# Patient Record
Sex: Female | Born: 1990 | Race: White | Hispanic: No | Marital: Single | Smoking: Never smoker
Health system: Southern US, Community
[De-identification: ages and names within clinical notes are randomized; demographics above are authoritative.]

## PROBLEM LIST (undated history)

## (undated) DIAGNOSIS — I498 Other specified cardiac arrhythmias: Secondary | ICD-10-CM

## (undated) DIAGNOSIS — Z5189 Encounter for other specified aftercare: Secondary | ICD-10-CM

## (undated) DIAGNOSIS — I951 Orthostatic hypotension: Secondary | ICD-10-CM

## (undated) DIAGNOSIS — F909 Attention-deficit hyperactivity disorder, unspecified type: Secondary | ICD-10-CM

## (undated) DIAGNOSIS — F419 Anxiety disorder, unspecified: Secondary | ICD-10-CM

## (undated) DIAGNOSIS — I7300 Raynaud's syndrome without gangrene: Secondary | ICD-10-CM

## (undated) DIAGNOSIS — R000 Tachycardia, unspecified: Secondary | ICD-10-CM

## (undated) DIAGNOSIS — G90A Postural orthostatic tachycardia syndrome (POTS): Secondary | ICD-10-CM

## (undated) HISTORY — DX: Anxiety disorder, unspecified: F41.9

## (undated) HISTORY — DX: Encounter for other specified aftercare: Z51.89

## (undated) HISTORY — DX: Attention-deficit hyperactivity disorder, unspecified type: F90.9

## (undated) HISTORY — DX: Raynaud's syndrome without gangrene: I73.00

---
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
## 2011-07-27 ENCOUNTER — Ambulatory Visit (INDEPENDENT_AMBULATORY_CARE_PROVIDER_SITE_OTHER): Payer: BC Managed Care – PPO | Admitting: Family Medicine

## 2011-07-27 ENCOUNTER — Ambulatory Visit: Payer: BC Managed Care – PPO

## 2011-07-27 VITALS — BP 127/77 | HR 87 | Temp 98.0°F | Resp 16 | Ht 64.5 in | Wt 117.0 lb

## 2011-07-27 DIAGNOSIS — R062 Wheezing: Secondary | ICD-10-CM

## 2011-07-27 DIAGNOSIS — R059 Cough, unspecified: Secondary | ICD-10-CM

## 2011-07-27 DIAGNOSIS — J40 Bronchitis, not specified as acute or chronic: Secondary | ICD-10-CM

## 2011-07-27 DIAGNOSIS — R05 Cough: Secondary | ICD-10-CM

## 2011-07-27 MED ORDER — BENZONATATE 100 MG PO CAPS: 100.0000 mg | ORAL_CAPSULE | Freq: Two times a day (BID) | ORAL | Status: AC | PRN

## 2011-07-27 MED ORDER — AZITHROMYCIN 250 MG PO TABS: ORAL_TABLET | ORAL | Status: AC

## 2011-07-27 NOTE — Progress Notes (Signed)
  [facility]:  Office Visit  Chief Complaint:  Chief Complaint  Patient presents with  . Cough    greenish-yellow phlegm x 3 weeks - has 1 round of amoxicillin  . Dizziness  . Wheezing    when lie down    HPI: Amy Navarro is a 21 y.o. female who complains of recurrent SOB, wheeze, green rhionorrhea, ear pain,subjective fevers. Was given Amoxacillin and cough syrup for sinusituis 3 weeks ago and sxs went away but now has returned. + Asthma, + wheeze at night.   Past Medical History  Diagnosis Date  . Asthma    History reviewed. No pertinent past surgical history. History   Social History  . Marital Status: Single    Spouse Name: N/A    Number of Children: N/A  . Years of Education: N/A   Social History Main Topics  . Smoking status: Never Smoker   . Smokeless tobacco: None  . Alcohol Use: Yes  . Drug Use: No  . Sexually Active: None   Other Topics Concern  . None   Social History Narrative  . None   Family History  Problem Relation Age of Onset  . Hypertension Father    Allergies  Allergen Reactions  . Mucinex Nausea And Vomiting   Prior to Admission medications   Medication Sig Start Date End Date Taking? Authorizing Provider  cetirizine (ZYRTEC) 10 MG tablet Take 10 mg by mouth daily.   Yes Historical Provider, MD  Fluticasone-Salmeterol (ADVAIR) 100-50 MCG/DOSE AEPB Inhale 1 puff into the lungs every 12 (twelve) hours.   Yes Historical Provider, MD  lisdexamfetamine (VYVANSE) 40 MG capsule Take 40 mg by mouth every morning.   Yes Historical Provider, MD  norgestimate-ethinyl estradiol (ORTHO-CYCLEN,SPRINTEC,PREVIFEM) 0.25-35 MG-MCG tablet Take 1 tablet by mouth daily.   Yes Historical Provider, MD     ROS: The patient denies fevers, chills, night sweats, unintentional weight loss, chest pain, palpitations, wheezing, dyspnea on exertion, nausea, vomiting, abdominal pain, dysuria, hematuria, melena, numbness, weakness, or tingling. +  cough  All other systems have been reviewed and were otherwise negative with the exception of those mentioned in the HPI and as above.    PHYSICAL EXAM: Filed Vitals:   07/27/11 1633  BP: 127/77  Pulse: 87  Temp: 98 F (36.7 C)  Resp: 16   Filed Vitals:   07/27/11 1633  Height: 5' 4.5" (1.638 m)  Weight: 117 lb (53.071 kg)   Body mass index is 19.77 kg/(m^2).  General: Alert, no acute distress HEENT:  Normocephalic, atraumatic, oropharynx patent. Tm nl, mild sinus tenderness, no exudates, red throat Cardiovascular:  Regular rate and rhythm, no rubs murmurs or gallops.  No Carotid bruits, radial pulse intact. No pedal edema.  Respiratory: Clear to auscultation bilaterally.  No wheezes, rales, or rhonchi.  No cyanosis, no use of accessory musculature GI: No organomegaly, abdomen is soft and non-tender, positive bowel sounds.  No masses. Skin: No rashes. Neurologic: Facial musculature symmetric. Psychiatric: Patient is appropriate throughout our interaction. Lymphatic: No cervical lymphadenopathy Musculoskeletal: Gait intact.   LABS: No results found for this or any previous visit.   EKG/XRAY:   Primary read interpreted by Dr[facility] Conley Rolls at [facility]. Increase vascularization; no pneumo or infiltrates.   ASSESSMENT/PLAN: Encounter Diagnoses  Name Primary?  . Wheeze Yes  . Bronchitis   . Cough    Bronchitis-Z pack Cough-Tessalon PErles    Cecil Vandyke PHUONG, DO 07/27/2011 5:34 PM

## 2011-07-29 ENCOUNTER — Telehealth: Payer: Self-pay

## 2011-07-29 NOTE — Telephone Encounter (Signed)
SAW DR 2 DAYS AGO AND WAS PRESCRIBED ANIBIOTICS AND TUSSI PEARLS FOR COUGH.  DAUGHTER'S COUGH GETTING WORSE, ON SPRING BREAK AND WILL BE LEAVING SHORTLY.  WOULD LIKE A CALL, MOM HAS QUESTIONS ABOUT SEVERITY OF COUGH

## 2011-07-30 NOTE — Telephone Encounter (Signed)
Pt mom is calling she would like for someone to call her about her daughters sever cough please contact Amy Navarro at [phone] she states no one has returned her call and she called yesterday.Marland KitchenMarland Kitchen

## 2011-07-31 MED ORDER — HYDROCOD POLST-CHLORPHEN POLST 10-8 MG/5ML PO LQCR: 5.0000 mL | Freq: Two times a day (BID) | ORAL | Status: DC | PRN

## 2011-07-31 NOTE — Telephone Encounter (Signed)
LMOM to CB. 

## 2011-07-31 NOTE — Telephone Encounter (Signed)
Mom called back requesting status... Still had not been reviewed.  Mother upset that it has taken this long to address issue.  Wants meds sent into pharmacy today, and wants Dr. Cleta Alberts to call her to discuss her care.  Dr. Cleta Alberts notified of situation.

## 2011-07-31 NOTE — Telephone Encounter (Signed)
Spoke with patients mother, Amy Navarro, who states that pt is still having trouble with cough.  Unable to sleep at night due to constant coughing.  Can we rx something stronger for cough?  Mom states Tussionex worked well in past, and she may need rf on [employer] if it continues.  Also, mom wonders if Prednisone shot is needed (has had prior) and if they can have fast track card so they don't have to wait.  Pls advise.  [facility] [address]

## 2011-07-31 NOTE — Telephone Encounter (Signed)
Called and discussed situation with the mother. They are not able to come back in. Will treat with Tussionex 1 teaspoon twice a day 3 ounces no refill this [facility] [address]. Advise she be rechecked if she has worsening. Advised the mother she can make an appointment to be seen at 104 for recheck.

## 2011-07-31 NOTE — Telephone Encounter (Signed)
Called to confirm pharmacy, mother would like it at [facility].  Will rx.

## 2013-04-09 ENCOUNTER — Telehealth: Payer: Self-pay

## 2013-04-09 NOTE — Telephone Encounter (Signed)
1) Chicken pox -  2) Hep B - titer

## 2013-04-09 NOTE — Telephone Encounter (Signed)
Requesting order for blood work (because of small blood veins)  Will be in town tomorrow night or to be done on Wednesday.   [phone]

## 2013-04-09 NOTE — Telephone Encounter (Signed)
Patient wants orders for titers please advise.

## 2013-04-10 NOTE — Telephone Encounter (Signed)
I do not think it is appropriate for [place] to do a lab only visit for this patient as the last time we have seen her is March 2013 for acute illness.  Pt needs to be seen.

## 2013-04-10 NOTE — Telephone Encounter (Signed)
Pts mother Amy Navarro called and wants dr Cleta Alberts to "write a referral for her daughter to go to a lab to get her blood drawn"   States they cant come into to our office and wait

## 2013-04-10 NOTE — Telephone Encounter (Signed)
Please advise, if we can order titers prior to the visit.

## 2013-04-10 NOTE — Telephone Encounter (Signed)
Please call mom if an order is put in the system.   She is talking with Appointments to try and get something down at 104 on Friday.  Mom just getting over pneumonia and does not a recurrance.   [phone]

## 2013-04-11 ENCOUNTER — Ambulatory Visit (INDEPENDENT_AMBULATORY_CARE_PROVIDER_SITE_OTHER): Payer: BC Managed Care – PPO | Admitting: Physician Assistant

## 2013-04-11 VITALS — BP 110/78 | HR 86 | Temp 97.7°F | Resp 20 | Ht 65.75 in | Wt 115.6 lb

## 2013-04-11 DIAGNOSIS — Z130 Encounter for screening for diseases of the blood and blood-forming organs and certain disorders involving the immune mechanism: Secondary | ICD-10-CM

## 2013-04-11 DIAGNOSIS — Z7189 Other specified counseling: Secondary | ICD-10-CM

## 2013-04-11 DIAGNOSIS — Z23 Encounter for immunization: Secondary | ICD-10-CM

## 2013-04-11 NOTE — Patient Instructions (Signed)
I will let you know as soon as your labs are back, and we can fax you a copy.    Immunization Schedule, Adult  Influenza vaccine.  All adults should be immunized every year.  All adults, including pregnant women and people with hives-only allergy to eggs can receive the inactivated influenza (IIV) vaccine.  Adults aged 21 49 years can receive the recombinant influenza (RIV) vaccine. The RIV vaccine does not contain any egg protein.  Adults aged 37 years or older can receive the standard-dose IIV or the high-dose IIV.  Tetanus, diphtheria, and acellular pertussis (Td, Tdap) vaccine.  Pregnant women should receive 1 dose of Tdap vaccine during each pregnancy. The dose should be obtained regardless of the length of time since the last dose. Immunization is preferred during the 27th to 36th week of gestation.  An adult who has not previously received Tdap or who does not know his or her vaccine status should receive 1 dose of Tdap. This initial dose should be followed by tetanus and diphtheria toxoids (Td) booster doses every 10 years.  Adults with an unknown or incomplete history of completing a 3-dose immunization series with Td-containing vaccines should begin or complete a primary immunization series including a Tdap dose.  Adults should receive a Td booster every 10 years.  Varicella vaccine.  An adult without evidence of immunity to varicella should receive 2 doses or a second dose if he or she has previously received 1 dose.  Pregnant females who do not have evidence of immunity should receive the first dose after pregnancy. This first dose should be obtained before leaving the health care facility. The second dose should be obtained 4 8 weeks after the first dose.  Human papillomavirus (HPV) vaccine.  Females aged 21 26 years who have not received the vaccine previously should obtain the 3-dose series.  The vaccine is not recommended for use in pregnant females. However,  pregnancy testing is not needed before receiving a dose. If a female is found to be pregnant after receiving a dose, no treatment is needed. In that case, the remaining doses should be delayed until after the pregnancy.  Males aged 21 21 years who have not received the vaccine previously should receive the 3-dose series. Males aged 21 26 years may be immunized.  Immunization is recommended through the age of 18 years for any female who has sex with males and did not get any or all doses earlier.  Immunization is recommended for any person with an immunocompromised condition through the age of 26 years if he or she did not get any or all doses earlier.  During the 3-dose series, the second dose should be obtained 4 8 weeks after the first dose. The third dose should be obtained 24 weeks after the first dose and 16 weeks after the second dose.  Zoster vaccine.  One dose is recommended for adults aged 21 years or older unless certain conditions are present.  Measles, mumps, and rubella (MMR) vaccine.  Adults born before 21 generally are considered immune to measles and mumps.  Adults born in 21 or later should have 1 or more doses of MMR vaccine unless there is a contraindication to the vaccine or there is laboratory evidence of immunity to each of the three diseases.  A routine second dose of MMR vaccine should be obtained at least 28 days after the first dose for students attending postsecondary schools, health care workers, or international travelers.  People who received inactivated measles  vaccine or an unknown type of measles vaccine during 21 1967 should receive 2 doses of MMR vaccine.  People who received inactivated mumps vaccine or an unknown type of mumps vaccine before 21 and are at high risk for mumps infection should consider immunization with 2 doses of MMR vaccine.  For females of childbearing age, rubella immunity should be determined. If there is no evidence of  immunity, females who are not pregnant should be vaccinated. If there is no evidence of immunity, females who are pregnant should delay immunization until after pregnancy.  Unvaccinated health care workers born before 21 who lack laboratory evidence of measles, mumps, or rubella immunity or laboratory confirmation of disease should consider measles and mumps immunization with 2 doses of MMR vaccine or rubella immunization with 1 dose of MMR vaccine.  Pneumococcal 13-valent conjugate (PCV13) vaccine.  When indicated, a person who is uncertain of his or her immunization history and has no record of immunization should receive the PCV13 vaccine.  An adult aged 21 years or older who has certain medical conditions and has not been previously immunized should receive 1 dose of PCV13 vaccine. This PCV13 should be followed with a dose of pneumococcal polysaccharide (PPSV23) vaccine. The PPSV23 vaccine dose should be obtained at least 8 weeks after the dose of PCV13 vaccine.  An adult aged 21 years or older who has certain medical conditions and previously received 1 or more doses of PPSV23 vaccine should receive 1 dose of PCV13. The PCV13 vaccine dose should be obtained 1 or more years after the last PPSV23 vaccine dose.  Pneumococcal polysaccharide (PPSV23) vaccine.  When PCV13 is also indicated, PCV13 should be obtained first.  All adults aged 21 years and older should be immunized.  An adult younger than age 21 years who has certain medical conditions should be immunized.  Any person who resides in a nursing home or long-term care facility should be immunized.  An adult smoker should be immunized.  People with an immunocompromised condition and certain other conditions should receive both PCV13 and PPSV23 vaccines.  People with human immunodeficiency virus (HIV) infection should be immunized as soon as possible after diagnosis.  Immunization during chemotherapy or radiation therapy should be  avoided.  Routine use of PPSV23 vaccine is not recommended for American Indians, [address], or people younger than 65 years unless there are medical conditions that require PPSV23 vaccine.  When indicated, people who have unknown immunization and have no record of immunization should receive PPSV23 vaccine.  One-time revaccination 5 years after the first dose of PPSV23 is recommended for people aged 17 64 years who have chronic kidney failure, nephrotic syndrome, asplenia, or immunocompromised conditions.  People who received 1 2 doses of PPSV23 before age 15 years should receive another dose of PPSV23 vaccine at age 48 years or later if at least 5 years have passed since the previous dose.  Doses of PPSV23 are not needed for people immunized with PPSV23 at or after age 22 years.  Meningococcal vaccine.  Adults with asplenia or persistent complement component deficiencies should receive 2 doses of quadrivalent meningococcal conjugate (MenACWY-D) vaccine. The doses should be obtained at least 2 months apart.  Microbiologists working with certain meningococcal bacteria, military recruits, people at risk during an outbreak, and people who travel to or live in countries with a high rate of meningitis should be immunized.  A first-year college student up through age 73 years who is living in a residence hall should receive a  dose if he or she did not receive a dose on or after his or her 16th birthday.  Adults who have certain high-risk conditions should receive one or more doses of vaccine.  Hepatitis A vaccine.  Adults who wish to be protected from this disease, have certain high-risk conditions, work with hepatitis A-infected animals, work in hepatitis A research labs, or travel to or work in countries with a high rate of hepatitis A should be immunized.  Adults who were previously unvaccinated and who anticipate close contact with an international adoptee during the first 60 days after  arrival in the [place] from a country with a high rate of hepatitis A should be immunized.  Hepatitis B vaccine.  Adults who wish to be protected from this disease, have certain high-risk conditions, may be exposed to blood or other infectious body fluids, are household contacts or sex partners of hepatitis B positive people, are clients or workers in certain care facilities, or travel to or work in countries with a high rate of hepatitis B should be immunized.  Haemophilus influenzae type b (Hib) vaccine.  A previously unvaccinated person with asplenia or sickle cell disease or having a scheduled splenectomy should receive 1 dose of Hib vaccine.  Regardless of previous immunization, a recipient of a hematopoietic stem cell transplant should receive a 3-dose series 6 12 months after his or her successful transplant.  Hib vaccine is not recommended for adults with HIV infection. Document Released: 07/24/2003 Document Revised: 08/28/2012 Document Reviewed: 06/20/2012 [facility] Patient Information 2014 [place], [place].

## 2013-04-11 NOTE — Telephone Encounter (Signed)
Spoke to patients mother to advise. She is very upset concerning this. Explained per Dr Cleta Alberts we have to follow protocol every time. Mother concerned about cost/ exposures to illnesses. Explained I understand this, however we can not give lab only orders without a visit, we must follow our protocols.  Dr Cleta Alberts is aware of the situation, and agrees. I did advise mother she may be able to take the orders from the school to the lab to have done, they may not even need [place] to provide the order. Mother voiced understanding, but is very upset we can not do this for her daughter.

## 2013-04-11 NOTE — Telephone Encounter (Signed)
Called left message for call back so I can advise.

## 2013-04-11 NOTE — Progress Notes (Signed)
   Subjective:    Patient ID: Amy Navarro, female    DOB: [birth year], 22 y.o.   MRN: [id number]  HPI   Ms. Kazee is a very pleasant 22 yr old female here for immunizations necessary for school.  She has ppw to be completed.  She requires a flu shot as well as varicella and hepatitis b titers.  She has completed the hepatitis b vaccine series.  She had chicken pox as a child.    She has had flu shots in the past and has never had an adverse reaction.  She feels well today.    Review of Systems  Constitutional: Negative for fever and chills.  Respiratory: Negative.   Cardiovascular: Negative.   Gastrointestinal: Negative.   Musculoskeletal: Negative.   Skin: Negative.        Objective:   Physical Exam  Vitals reviewed. Constitutional: She is oriented to person, place, and time. She appears well-developed and well-nourished. No distress.  HENT:  Head: Normocephalic and atraumatic.  Eyes: Conjunctivae are normal. No scleral icterus.  Cardiovascular: Normal rate, regular rhythm, normal heart sounds and intact distal pulses.   Pulmonary/Chest: Effort normal and breath sounds normal. She has no wheezes. She has no rales.  Lymphadenopathy:    She has no cervical adenopathy.  Neurological: She is alert and oriented to person, place, and time.  Skin: Skin is warm and dry.  Psychiatric: She has a normal mood and affect. Her behavior is normal.       Assessment & Plan:  Immunization counseling - Plan: Flu Vaccine QUAD 36+ mos IM, Varicella zoster antibody, IgG, Hepatitis B surface antibody  Need for prophylactic vaccination and inoculation against influenza - Plan: Flu Vaccine QUAD 36+ mos IM   Ms. Faulds is a very pleasant 22 yr old female here for immunizations.  Flu vaccine administered today and documentation provided to pt.  Varicella and hep B titers pending.  Will notify pt of lab results and mail or fax copy for school.  Copy of ppw sent for scanning.  Pt has original  form.  Loleta Dicker MHS, PA-C [facility] 11/26/20145:38 PM

## 2013-04-12 LAB — VARICELLA ZOSTER ANTIBODY, IGG: Varicella IgG: 794.2 Index — ABNORMAL HIGH (ref <135.00)

## 2013-04-12 LAB — HEPATITIS B SURFACE ANTIBODY, QUANTITATIVE: Hepatitis B-Post: 0.1 m[IU]/mL

## 2013-04-13 ENCOUNTER — Ambulatory Visit (INDEPENDENT_AMBULATORY_CARE_PROVIDER_SITE_OTHER): Payer: BC Managed Care – PPO | Admitting: Family Medicine

## 2013-04-13 VITALS — BP 128/82 | HR 90 | Temp 98.4°F | Resp 18 | Ht 65.75 in | Wt 115.8 lb

## 2013-04-13 DIAGNOSIS — J45990 Exercise induced bronchospasm: Secondary | ICD-10-CM | POA: Insufficient documentation

## 2013-04-13 DIAGNOSIS — I7300 Raynaud's syndrome without gangrene: Secondary | ICD-10-CM

## 2013-04-13 DIAGNOSIS — N760 Acute vaginitis: Secondary | ICD-10-CM

## 2013-04-13 DIAGNOSIS — B373 Candidiasis of vulva and vagina: Secondary | ICD-10-CM

## 2013-04-13 DIAGNOSIS — B9689 Other specified bacterial agents as the cause of diseases classified elsewhere: Secondary | ICD-10-CM

## 2013-04-13 DIAGNOSIS — N898 Other specified noninflammatory disorders of vagina: Secondary | ICD-10-CM

## 2013-04-13 DIAGNOSIS — Z23 Encounter for immunization: Secondary | ICD-10-CM

## 2013-04-13 LAB — POCT WET PREP WITH KOH
KOH Prep POC: POSITIVE
Yeast Wet Prep HPF POC: NEGATIVE

## 2013-04-13 MED ORDER — METRONIDAZOLE 500 MG PO TABS: 500.0000 mg | ORAL_TABLET | Freq: Two times a day (BID) | ORAL | Status: DC

## 2013-04-13 MED ORDER — FLUCONAZOLE 150 MG PO TABS: 150.0000 mg | ORAL_TABLET | Freq: Once | ORAL | Status: DC

## 2013-04-13 NOTE — Patient Instructions (Addendum)
You received the first Hep B vaccine in your second series today.  You should have your 2nd and 3rd shots in 1 and 6 months, respectively.  2 or 3 months after you complete this series you should be tested again for immunity.  If you are still negative you will probably not respond to any further shots- you do not need to have the series a 3rd time  Go ahead and take the difulcan for your yeast infection.  Take the flayl for BV when you can but remember to avoid ALL alcohol use within 3 days of taking this medicat

## 2013-04-13 NOTE — Progress Notes (Signed)
[facility] [address] [place] [address] [phone]  Date:  04/13/2013   Name:  Amy Navarro   DOB:  1990-10-30   MRN:  [id number]  PCP:  No primary provider on file.    Chief Complaint: Vaginal Discharge and Vaginal Itching   History of Present Illness:  Amy Navarro is a 22 y.o. very pleasant female patient who presents with the following:  She has noted some vaginal discharge and itching for the last or 4 weeks. It got better, then came back.   LMP 03/20/13.  No urinary sx.   No fever, chills, back pain or abdominal pain.  She is SA with one female partner.  No new partners recently.   She is generally healthy  At her last visit she had some titers drawn and would like to have these resutls today.  Went over these results; she is immune to varicella but not to hep B. She would like to start re-vaccination today.    Office Visit on 04/11/2013  Component Date Value Range Status  . Varicella IgG 04/11/2013 794.20* <135.00 Index Final   Comment:                            Reference Range:       <135.00 Index = Negative                                           135.00-164.99 Index = Equivocal                                                >=165.00 Index = Positive                             . Hepatitis B-Post 04/11/2013 0.1   Final   Comment: A level of 10.0 mIU/mL or greater after 3 doses of Hepatitis B Vaccine                          suggests immunity to Hepatitis B.                                                     This test is performed using the [employer] Chemiluminescence                          method.  Quantitative results from this method should not be used                          interchangeably with other methods.     There are no active problems to display for this patient.   Past Medical History  Diagnosis Date  . Asthma   . Blood transfusion without reported diagnosis     History reviewed. No pertinent past  surgical history.  History  Substance Use Topics  . Smoking status: Never Smoker   .  Smokeless tobacco: Not on file  . Alcohol Use: 2.5 oz/week    5 drink(s) per week    Family History  Problem Relation Age of Onset  . Hypertension Father   . Hyperlipidemia Father     Allergies  Allergen Reactions  . Latex Hives, Shortness Of Breath and Rash  . Guaifenesin Er Nausea And Vomiting    Medication list has been reviewed and updated.  Current Outpatient Prescriptions on File Prior to Visit  Medication Sig Dispense Refill  . amphetamine-dextroamphetamine (ADDERALL) 20 MG tablet Take 20 mg by mouth daily.      . Fluticasone-Salmeterol (ADVAIR) 100-50 MCG/DOSE AEPB Inhale 1 puff into the lungs every 12 (twelve) hours.      . norgestimate-ethinyl estradiol (ORTHO-CYCLEN,SPRINTEC,PREVIFEM) 0.25-35 MG-MCG tablet Take 1 tablet by mouth daily.       No current facility-administered medications on file prior to visit.    Review of Systems:  As per HPI- otherwise negative.   Physical Examination: Filed Vitals:   04/13/13 1135  BP: 128/82  Pulse: 124  Temp: 98.4 F (36.9 C)  Resp: 18   Filed Vitals:   04/13/13 1135  Height: 5' 5.75" (1.67 m)  Weight: 115 lb 12.8 oz (52.527 kg)   Body mass index is 18.83 kg/(m^2). Ideal Body Weight: Weight in (lb) to have BMI = 25: 153.4  GEN: WDWN, NAD, Non-toxic, A & O x 3, looks well HEENT: Atraumatic, Normocephalic. Neck supple. No masses, No LAD. Ears and Nose: No external deformity. CV: RRR, No M/G/R. No JVD. No thrill. No extra heart sounds. PULM: CTA B, no wheezes, crackles, rhonchi. No retractions. No resp. distress. No accessory muscle use. ABD: S, NT, ND. No rebound. No HSM. EXTR: No c/c/e NEURO Normal gait.  PSYCH: Normally interactive. Conversant. Not depressed or anxious appearing.  Calm demeanor.  GU: vaginal irritation and thick white discharge is .  Otherwise exam is normal, no CMT, no adnexal masses or tenderness/     Results for orders placed in visit on 04/13/13  POCT WET PREP WITH KOH      Result Value Range   Trichomonas, UA Negative     Clue Cells Wet Prep HPF POC tntc     Epithelial Wet Prep HPF POC neg     Yeast Wet Prep HPF POC neg     Bacteria Wet Prep HPF POC 3+     RBC Wet Prep HPF POC 2-7     WBC Wet Prep HPF POC tntc     KOH Prep POC Positive       Assessment and Plan: BV (bacterial vaginosis) - Plan: metroNIDAZOLE (FLAGYL) 500 MG tablet  Exercise-induced asthma  Raynaud's disease  Vaginal discharge - Plan: POCT Wet Prep with KOH, GC/Chlamydia Probe Amp  Yeast vaginitis - Plan: fluconazole (DIFLUCAN) 150 MG tablet  Immunization due - Plan: Hepatitis B vaccine adult IM  Treat for BV and yeast vaginitis with diflucan and flagyl.   Start Hep B series over today.  See patient instructions for more details.     Signed Abbe Amsterdam, MD

## 2013-04-14 LAB — GC/CHLAMYDIA PROBE AMP
CT Probe RNA: NEGATIVE
GC Probe RNA: NEGATIVE

## 2013-04-15 ENCOUNTER — Encounter: Payer: Self-pay | Admitting: Family Medicine

## 2013-09-05 ENCOUNTER — Ambulatory Visit (INDEPENDENT_AMBULATORY_CARE_PROVIDER_SITE_OTHER): Payer: BC Managed Care – PPO | Admitting: Family Medicine

## 2013-09-05 VITALS — BP 116/74 | HR 87 | Temp 98.7°F | Resp 16 | Ht 63.0 in | Wt 117.0 lb

## 2013-09-05 DIAGNOSIS — T148 Other injury of unspecified body region: Secondary | ICD-10-CM

## 2013-09-05 DIAGNOSIS — I7300 Raynaud's syndrome without gangrene: Secondary | ICD-10-CM

## 2013-09-05 DIAGNOSIS — M79673 Pain in unspecified foot: Secondary | ICD-10-CM

## 2013-09-05 DIAGNOSIS — L603 Nail dystrophy: Secondary | ICD-10-CM

## 2013-09-05 DIAGNOSIS — L608 Other nail disorders: Secondary | ICD-10-CM

## 2013-09-05 DIAGNOSIS — L259 Unspecified contact dermatitis, unspecified cause: Secondary | ICD-10-CM

## 2013-09-05 DIAGNOSIS — W57XXXA Bitten or stung by nonvenomous insect and other nonvenomous arthropods, initial encounter: Secondary | ICD-10-CM

## 2013-09-05 DIAGNOSIS — L708 Other acne: Secondary | ICD-10-CM

## 2013-09-05 DIAGNOSIS — J3489 Other specified disorders of nose and nasal sinuses: Secondary | ICD-10-CM

## 2013-09-05 DIAGNOSIS — M79609 Pain in unspecified limb: Secondary | ICD-10-CM

## 2013-09-05 DIAGNOSIS — M79643 Pain in unspecified hand: Secondary | ICD-10-CM

## 2013-09-05 LAB — POCT URINALYSIS DIPSTICK
BILIRUBIN UA: NEGATIVE
Blood, UA: NEGATIVE
GLUCOSE UA: NEGATIVE
Ketones, UA: NEGATIVE
LEUKOCYTES UA: NEGATIVE
Nitrite, UA: NEGATIVE
Protein, UA: NEGATIVE
Spec Grav, UA: 1.01
Urobilinogen, UA: 0.2
pH, UA: 6

## 2013-09-05 LAB — POCT CBC
GRANULOCYTE PERCENT: 69.1 % (ref 37–80)
HCT, POC: 47.3 % (ref 37.7–47.9)
Hemoglobin: 15.5 g/dL (ref 12.2–16.2)
LYMPH, POC: 2 (ref 0.6–3.4)
MCH, POC: 31 pg (ref 27–31.2)
MCHC: 32.8 g/dL (ref 31.8–35.4)
MCV: 94.6 fL (ref 80–97)
MID (cbc): 0.3 (ref 0–0.9)
MPV: 9.7 fL (ref 0–99.8)
PLATELET COUNT, POC: 189 10*3/uL (ref 142–424)
POC GRANULOCYTE: 5.2 (ref 2–6.9)
POC LYMPH %: 26.3 % (ref 10–50)
POC MID %: 4.6 %M (ref 0–12)
RBC: 5 M/uL (ref 4.04–5.48)
RDW, POC: 13.4 %
WBC: 7.5 10*3/uL (ref 4.6–10.2)

## 2013-09-05 MED ORDER — DOXYCYCLINE HYCLATE 100 MG PO CAPS: 100.0000 mg | ORAL_CAPSULE | Freq: Two times a day (BID) | ORAL | Status: DC

## 2013-09-05 MED ORDER — AMLODIPINE BESYLATE 2.5 MG PO TABS: 2.5000 mg | ORAL_TABLET | Freq: Every day | ORAL | Status: DC

## 2013-09-05 NOTE — Progress Notes (Signed)
Subjective: 23 year-old lady who is here with a number of things.. she was bit on the nape of her neck by a tick which she removed yesterday. She thinks she probably picked up the day before. She has it in a jar and is still alive.  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 Shortly thereafter the patient is sore in her left nares, then broke out with a rash on her for head. She has an appointment to see a dermatologist for this next week. However she was referred that this was staph infection her skin. She is not someone who has a history of much acne.  Over month ago she dropped something on her right large toe. The toenail looks strange and she wondered whether it was coming off.  She has a long history of having Raynaud-like constriction of the vessels in her toes and hands with blanching of the digits or grayish dark discoloration. Apparently several relatives have Raynaud's phenomena. She gets a lot of pain in her hands and feet, especially in cold weather. She has not had any testing done this. She has [place] but did not receive any treatment.  She is otherwise healthy. She is sexually involved. She faithfully uses her birth-control and is not pregnant.  Objective: Acneiform rash on her forehead. Slight red in the left nares but no sores noted. Culture was taken. The nape of the neck has no major bite marks. Her tick is still alive and wondering around in a little jar. She has some dystrophy of the right large toenail but the band portion of the nail seems to be growing out with a new base to it. It does not look like it is coming loose. Reviewed her pictures which are very excellent displays of Raynaud's in her toes and fingers.  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 Treat the acneiform rash with doxycycline for 10 days until she sees her dermatologist Begin her on amlodipine 2.5 mg one daily for the fingers and toes Return in 6 or 8 weeks for recheck, sooner if problems

## 2013-09-05 NOTE — Patient Instructions (Signed)
Take the doxycycline one twice daily for the rash on the forehead. Avoid the sun all on this medicine you may burn easily.  I will let you know the results of your labs in a few days hopefully. Some of this may take a week to return.  If he gets fever or sick come in sooner because tick bite. However the doxycycline should cover well anyhow.  The nail should continue to grow out normally.  Take the medicine for your fingers and toes one daily. He did get lightheaded or dizzy it may be because it drops your blood pressure a little bit. I don't think that will be a problem at this dose.  I will see her back in about June, sooner if problems. Please call in June and find out what else in the office and come in on one of my shift.

## 2013-09-06 ENCOUNTER — Other Ambulatory Visit (INDEPENDENT_AMBULATORY_CARE_PROVIDER_SITE_OTHER): Payer: BC Managed Care – PPO

## 2013-09-06 DIAGNOSIS — W57XXXA Bitten or stung by nonvenomous insect and other nonvenomous arthropods, initial encounter: Secondary | ICD-10-CM

## 2013-09-06 DIAGNOSIS — L259 Unspecified contact dermatitis, unspecified cause: Secondary | ICD-10-CM

## 2013-09-06 DIAGNOSIS — L608 Other nail disorders: Secondary | ICD-10-CM

## 2013-09-06 DIAGNOSIS — T148 Other injury of unspecified body region: Secondary | ICD-10-CM

## 2013-09-06 DIAGNOSIS — I7300 Raynaud's syndrome without gangrene: Secondary | ICD-10-CM

## 2013-09-06 DIAGNOSIS — J3489 Other specified disorders of nose and nasal sinuses: Secondary | ICD-10-CM

## 2013-09-06 DIAGNOSIS — M79609 Pain in unspecified limb: Secondary | ICD-10-CM

## 2013-09-06 LAB — COMPREHENSIVE METABOLIC PANEL
ALBUMIN: 4.3 g/dL (ref 3.5–5.2)
ALK PHOS: 37 U/L — AB (ref 39–117)
ALT: 11 U/L (ref 0–35)
AST: 12 U/L (ref 0–37)
BILIRUBIN TOTAL: 0.3 mg/dL (ref 0.2–1.2)
BUN: 18 mg/dL (ref 6–23)
CO2: 25 mEq/L (ref 19–32)
Calcium: 9.5 mg/dL (ref 8.4–10.5)
Chloride: 103 mEq/L (ref 96–112)
Creat: 0.84 mg/dL (ref 0.50–1.10)
Glucose, Bld: 116 mg/dL — ABNORMAL HIGH (ref 70–99)
POTASSIUM: 4.1 meq/L (ref 3.5–5.3)
Sodium: 138 mEq/L (ref 135–145)
Total Protein: 7 g/dL (ref 6.0–8.3)

## 2013-09-06 LAB — RHEUMATOID FACTOR: Rhuematoid fact SerPl-aCnc: 12 IU/mL (ref <=14)

## 2013-09-06 NOTE — Progress Notes (Signed)
Patient here for labs only.  Unable to get specimen prior.

## 2013-09-07 LAB — ANA: ANA: NEGATIVE

## 2013-09-08 ENCOUNTER — Encounter: Payer: Self-pay | Admitting: Family Medicine

## 2013-09-08 LAB — WOUND CULTURE
GRAM STAIN: NONE SEEN
Organism ID, Bacteria: NORMAL

## 2013-09-08 LAB — C4 COMPLEMENT: C4 Complement: 23 mg/dL (ref 10–40)

## 2013-09-08 LAB — C3 COMPLEMENT: C3 Complement: 114 mg/dL (ref 90–180)

## 2014-10-02 ENCOUNTER — Ambulatory Visit (INDEPENDENT_AMBULATORY_CARE_PROVIDER_SITE_OTHER): Payer: BC Managed Care – PPO | Admitting: Physician Assistant

## 2014-10-02 VITALS — BP 130/64 | HR 72 | Temp 98.3°F | Resp 16 | Ht 65.0 in | Wt 116.0 lb

## 2014-10-02 DIAGNOSIS — Z13220 Encounter for screening for lipoid disorders: Secondary | ICD-10-CM

## 2014-10-02 DIAGNOSIS — Z131 Encounter for screening for diabetes mellitus: Secondary | ICD-10-CM

## 2014-10-02 DIAGNOSIS — Z7189 Other specified counseling: Secondary | ICD-10-CM

## 2014-10-02 DIAGNOSIS — Z130 Encounter for screening for diseases of the blood and blood-forming organs and certain disorders involving the immune mechanism: Secondary | ICD-10-CM | POA: Diagnosis not present

## 2014-10-02 DIAGNOSIS — Z111 Encounter for screening for respiratory tuberculosis: Secondary | ICD-10-CM | POA: Diagnosis not present

## 2014-10-02 DIAGNOSIS — Z1329 Encounter for screening for other suspected endocrine disorder: Secondary | ICD-10-CM | POA: Diagnosis not present

## 2014-10-02 DIAGNOSIS — Z0000 Encounter for general adult medical examination without abnormal findings: Secondary | ICD-10-CM

## 2014-10-02 DIAGNOSIS — Z1159 Encounter for screening for other viral diseases: Secondary | ICD-10-CM

## 2014-10-02 DIAGNOSIS — Z7185 Encounter for immunization safety counseling: Secondary | ICD-10-CM

## 2014-10-02 LAB — POCT UA - MICROSCOPIC ONLY
Bacteria, U Microscopic: NEGATIVE
Casts, Ur, LPF, POC: NEGATIVE
Crystals, Ur, HPF, POC: NEGATIVE
Mucus, UA: NEGATIVE
RBC, URINE, MICROSCOPIC: NEGATIVE
WBC, Ur, HPF, POC: NEGATIVE
Yeast, UA: NEGATIVE

## 2014-10-02 LAB — POCT CBC
GRANULOCYTE PERCENT: 61.8 % (ref 37–80)
HCT, POC: 41.6 % (ref 37.7–47.9)
Hemoglobin: 13.8 g/dL (ref 12.2–16.2)
Lymph, poc: 2.4 (ref 0.6–3.4)
MCH: 30.4 pg (ref 27–31.2)
MCHC: 33.2 g/dL (ref 31.8–35.4)
MCV: 91.5 fL (ref 80–97)
MID (CBC): 0.5 (ref 0–0.9)
MPV: 7.5 fL (ref 0–99.8)
PLATELET COUNT, POC: 285 10*3/uL (ref 142–424)
POC Granulocyte: 4.8 (ref 2–6.9)
POC LYMPH %: 31.7 % (ref 10–50)
POC MID %: 6.5 %M (ref 0–12)
RBC: 4.54 M/uL (ref 4.04–5.48)
RDW, POC: 12.7 %
WBC: 7.7 10*3/uL (ref 4.6–10.2)

## 2014-10-02 LAB — POCT URINALYSIS DIPSTICK
Bilirubin, UA: NEGATIVE
Blood, UA: NEGATIVE
Glucose, UA: NEGATIVE
Ketones, UA: NEGATIVE
Leukocytes, UA: NEGATIVE
Nitrite, UA: NEGATIVE
PH UA: 6
PROTEIN UA: 30
SPEC GRAV UA: 1.02
Urobilinogen, UA: 0.2

## 2014-10-02 NOTE — Progress Notes (Signed)
Subjective:    Patient ID: Amy Navarro, female    DOB: 10/07/1990, 24 y.o.   MRN: [id number]  HPI Patient presents for complete physical and to have forms for grad school completed. Has been well in the interim and will be attending [employer] for [employer]ational Therapy in the fall. Will be commuting to school and staying with her mother. Is very excited to get started, however, is close to the deadline to have forms turned in and is a little anxious about that.   Works out 3x/week and endorses eating a healthy diet, but drinking too much coffee. Has regular, non-painful bowel movements. Does not smoke tobacco or use illicit drugs. Drinks beer 3x/week. Sexually active with boyfriend of 1 1/2 years and they do not use condoms, however, both have been tested and are STD negative. She is on birth control pill. Works as a Technical brewerrehabilitation tech and enjoys job.   Health Maintenance: Pap 04/2014 was abn, but [facility]. Immunization UTD. Wears seatbelts.     Review of Systems  Constitutional: Negative.  Negative for fever, chills, activity change, appetite change, fatigue and unexpected weight change.  HENT: Negative.  Negative for tinnitus.   Eyes: Negative.  Negative for photophobia and visual disturbance.  Respiratory: Negative.  Negative for cough, shortness of breath and wheezing.   Cardiovascular: Negative.  Negative for chest pain, palpitations and leg swelling.  Gastrointestinal: Negative.  Negative for nausea, vomiting, diarrhea, constipation and blood in stool.  Genitourinary: Negative.  Negative for dysuria, hematuria and decreased urine volume.  Musculoskeletal: Negative for myalgias, back pain, joint swelling, arthralgias and gait problem.  Neurological: Negative.  Negative for dizziness, light-headedness and headaches.  Psychiatric/Behavioral: Negative for suicidal ideas, behavioral problems, sleep disturbance, dysphoric mood and agitation. The  patient is nervous/anxious.        Objective:   Physical Exam  Constitutional: She is oriented to person, place, and time. She appears well-developed and well-nourished. No distress.  Blood pressure 130/64, pulse 72, temperature 98.3 F (36.8 C), temperature source Oral, resp. rate 16, height 5\' 5"  (1.651 m), weight 116 lb (52.617 kg), last menstrual period 09/02/2014, SpO2 99 %.   HENT:  Head: Normocephalic and atraumatic.  Right Ear: External ear normal.  Left Ear: External ear normal.  Mouth/Throat: Oropharynx is clear and moist. No oropharyngeal exudate.  Eyes: Conjunctivae and EOM are normal. Pupils are equal, round, and reactive to light. Right eye exhibits no discharge. Left eye exhibits no discharge. No scleral icterus.  Neck: Normal range of motion. Neck supple. No thyromegaly present.  Cardiovascular: Normal rate, regular rhythm and normal heart sounds.  Exam reveals no gallop and no friction rub.   No murmur heard. Pulmonary/Chest: Effort normal and breath sounds normal. No respiratory distress. She has no wheezes. She has no rales.  Abdominal: Soft. Bowel sounds are normal. She exhibits no distension and no mass. There is no tenderness. There is no rebound and no guarding.  Musculoskeletal: Normal range of motion. She exhibits no edema or tenderness.  Lymphadenopathy:    She has no cervical adenopathy.  Neurological: She is alert and oriented to person, place, and time. She has normal reflexes. No cranial nerve deficit. She exhibits normal muscle tone. Coordination normal.  Skin: Skin is warm and dry. No rash noted. She is not diaphoretic. No erythema. No pallor.  Psychiatric: She has a normal mood and affect. Her behavior is normal. Judgment and thought content normal.   Results for  orders placed or performed in visit on 10/02/14  POCT urinalysis dipstick  Result Value Ref Range   Color, UA yellow    Clarity, UA clear    Glucose, UA neg    Bilirubin, UA neg    Ketones,  UA neg    Spec Grav, UA 1.020    Blood, UA neg    pH, UA 6.0    Protein, UA 30    Urobilinogen, UA 0.2    Nitrite, UA neg    Leukocytes, UA Negative   POCT UA - Microscopic Only  Result Value Ref Range   WBC, Ur, HPF, POC neg    RBC, urine, microscopic neg    Bacteria, U Microscopic neg    Mucus, UA neg    Epithelial cells, urine per micros 0-6    Crystals, Ur, HPF, POC neg    Casts, Ur, LPF, POC neg    Yeast, UA neg   POCT CBC  Result Value Ref Range   WBC 7.7 4.6 - 10.2 K/uL   Lymph, poc 2.4 0.6 - 3.4   POC LYMPH PERCENT 31.7 10 - 50 %L   MID (cbc) 0.5 0 - 0.9   POC MID % 6.5 0 - 12 %M   POC Granulocyte 4.8 2 - 6.9   Granulocyte percent 61.8 37 - 80 %G   RBC 4.54 4.04 - 5.48 M/uL   Hemoglobin 13.8 12.2 - 16.2 g/dL   HCT, POC 16.141.6 09.637.7 - 47.9 %   MCV 91.5 80 - 97 fL   MCH, POC 30.4 27 - 31.2 pg   MCHC 33.2 31.8 - 35.4 g/dL   RDW, POC 04.512.7 %   Platelet Count, POC 285 142 - 424 K/uL   MPV 7.5 0 - 99.8 fL      Assessment & Plan:  1. Annual physical exam Anticipatory guidance discussed. Safe sex practices discussed. Deferred additional STD screening.  2. Screening for diabetes mellitus - Comprehensive metabolic panel - POCT urinalysis dipstick - POCT UA - Microscopic Only  3. Screening for cholesterol level - Lipid panel  4. Screening for thyroid disorder - TSH  5. Screening for tuberculosis - Quantiferon tb gold assay (blood)  6. Need for hepatitis B screening test - Hepatitis B surface antibody  7. Screening for deficiency anemia - POCT CBC  8. Immunization counseling Forms completed.   Janan Ridgeishira Tyrique Sporn PA-C  [facility] 10/02/2014 5:49 PM

## 2014-10-03 ENCOUNTER — Telehealth: Payer: Self-pay

## 2014-10-03 ENCOUNTER — Telehealth: Payer: Self-pay | Admitting: Physician Assistant

## 2014-10-03 DIAGNOSIS — Z8619 Personal history of other infectious and parasitic diseases: Secondary | ICD-10-CM

## 2014-10-03 DIAGNOSIS — E875 Hyperkalemia: Secondary | ICD-10-CM

## 2014-10-03 LAB — TSH: TSH: 2.5 u[IU]/mL (ref 0.350–4.500)

## 2014-10-03 LAB — LIPID PANEL
CHOL/HDL RATIO: 3.1 ratio
Cholesterol: 231 mg/dL — ABNORMAL HIGH (ref 0–200)
HDL: 74 mg/dL (ref >=46)
LDL CALC: 138 mg/dL — AB (ref 0–99)
Triglycerides: 96 mg/dL (ref <150)
VLDL: 19 mg/dL (ref 0–40)

## 2014-10-03 LAB — COMPREHENSIVE METABOLIC PANEL
ALT: 12 U/L (ref 0–35)
AST: 33 U/L (ref 0–37)
Albumin: 4.7 g/dL (ref 3.5–5.2)
Alkaline Phosphatase: 22 U/L — ABNORMAL LOW (ref 39–117)
BILIRUBIN TOTAL: 0.4 mg/dL (ref 0.2–1.2)
BUN: 10 mg/dL (ref 6–23)
CALCIUM: 9.6 mg/dL (ref 8.4–10.5)
CO2: 23 meq/L (ref 19–32)
Chloride: 101 mEq/L (ref 96–112)
Creat: 0.55 mg/dL (ref 0.50–1.10)
GLUCOSE: 67 mg/dL — AB (ref 70–99)
POTASSIUM: 6.4 meq/L — AB (ref 3.5–5.3)
SODIUM: 135 meq/L (ref 135–145)
Total Protein: 7.6 g/dL (ref 6.0–8.3)

## 2014-10-03 LAB — HEPATITIS B SURFACE ANTIBODY, QUANTITATIVE: Hepatitis B-Post: 95.2 m[IU]/mL

## 2014-10-03 NOTE — Telephone Encounter (Signed)
K+ elevated likely secondary to hemolysis. Patient agrees to come in for redraw. She additionally request varicella titer. Will put in labs only visit for both. Rest of labs relayed. Discussed diet mod.

## 2014-10-03 NOTE — Telephone Encounter (Signed)
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 Wants us to call Amy Navarro back at [phone].

## 2014-10-04 ENCOUNTER — Other Ambulatory Visit (INDEPENDENT_AMBULATORY_CARE_PROVIDER_SITE_OTHER): Payer: BC Managed Care – PPO

## 2014-10-04 ENCOUNTER — Telehealth: Payer: Self-pay

## 2014-10-04 DIAGNOSIS — E875 Hyperkalemia: Secondary | ICD-10-CM | POA: Diagnosis not present

## 2014-10-04 DIAGNOSIS — Z8619 Personal history of other infectious and parasitic diseases: Secondary | ICD-10-CM

## 2014-10-04 LAB — QUANTIFERON TB GOLD ASSAY (BLOOD)
Interferon Gamma Release Assay: NEGATIVE
QUANTIFERON TB AG MINUS NIL: 0 [IU]/mL
Quantiferon Nil Value: 0.03 IU/mL
TB Ag value: 0.02 IU/mL

## 2014-10-04 LAB — POTASSIUM: Potassium: 4.2 mEq/L (ref 3.5–5.3)

## 2014-10-04 NOTE — Telephone Encounter (Signed)
Patient has been seen at the office.

## 2014-10-04 NOTE — Telephone Encounter (Signed)
Tishira, pt came in today for her blood draw and left 2 forms that need to be filled out for college. I filled out what I could and printed her immunization record. It is all in your box. Thanks

## 2014-10-07 ENCOUNTER — Telehealth: Payer: Self-pay | Admitting: Physician Assistant

## 2014-10-07 LAB — VARICELLA ZOSTER ANTIBODY, IGG: Varicella IgG: 1384 Index — ABNORMAL HIGH (ref <135.00)

## 2014-10-07 NOTE — Telephone Encounter (Signed)
Needs for sets completed as it is for two different sites.     [phone]

## 2014-10-07 NOTE — Telephone Encounter (Signed)
Left vmail. Forms are complete and are ready for pick up along with immunization records. Did not complete second packet as it is the same question as the first and coming from the same organization. If for some reason you need both give me a call back.   Rest of labs are normal. K+ that was elevated last time is normal.

## 2014-10-07 NOTE — Telephone Encounter (Signed)
[email or web address] 

## 2014-10-16 ENCOUNTER — Ambulatory Visit (INDEPENDENT_AMBULATORY_CARE_PROVIDER_SITE_OTHER): Payer: BC Managed Care – PPO | Admitting: Emergency Medicine

## 2014-10-16 VITALS — BP 124/80 | HR 98 | Temp 98.0°F | Resp 17 | Ht 65.0 in | Wt 115.0 lb

## 2014-10-16 DIAGNOSIS — J0100 Acute maxillary sinusitis, unspecified: Secondary | ICD-10-CM

## 2014-10-16 DIAGNOSIS — J029 Acute pharyngitis, unspecified: Secondary | ICD-10-CM | POA: Diagnosis not present

## 2014-10-16 LAB — POCT RAPID STREP A (OFFICE): RAPID STREP A SCREEN: NEGATIVE

## 2014-10-16 MED ORDER — FLUTICASONE PROPIONATE 50 MCG/ACT NA SUSP: 2.0000 | Freq: Every day | NASAL | Status: DC

## 2014-10-16 MED ORDER — AMOXICILLIN 875 MG PO TABS
875.0000 mg | ORAL_TABLET | Freq: Two times a day (BID) | ORAL | Status: DC
Start: 2014-10-16 — End: 2015-02-12

## 2014-10-16 NOTE — Progress Notes (Signed)
   Subjective:    Patient ID: Amy Navarro, female    DOB: 04/30/1991, 24 y.o.   MRN: [id number]  HPI  I scribed this chart for Dr Cleta Albertsaub in his presence, Rico Sheehanebekah Clark, RT (R)  Pt complains of starting to feel bad on Sunday. Her throat is scratchy, head throbs when she bends over, facial pain. Negative for coughing. Painful lymph node in neck.     Review of Systems  Constitutional: Negative.   HENT: Positive for congestion, postnasal drip and sinus pressure. Negative for ear pain and nosebleeds.   Eyes: Negative.   Respiratory: Negative for cough, shortness of breath and wheezing.   Cardiovascular: Negative.   Gastrointestinal: Negative.   Musculoskeletal patient was practicing defensive moves on Saturday and did have a instructor to a strangle type hold on her neck but she did not have any symptoms following this until her respiratory illness started.     Objective:   Physical Exam  Constitutional: She appears well-developed and well-nourished.  HENT:  Head: Normocephalic.  There is tenderness over the right ethmoid sinus. There is tenderness over both maxillary sinuses.  Eyes: Pupils are equal, round, and reactive to light.  Neck: Normal range of motion.  Musculoskeletal:  There is no tenderness over the posterior cervical nodes there is full range of motion of the neck there is no upper extremity weakness.   Results for orders placed or performed in visit on 10/16/14  POCT rapid strep A  Result Value Ref Range   Rapid Strep A Screen Negative Negative         Assessment & Plan:  We'll treat with amoxicillin and Flonase. She was advised to use birth control backup. Recheck if symptoms persist.I personally performed the services described in this documentation, which was scribed in my presence. The recorded information has been reviewed and is accurate.  Earl LitesSteve Morrisa Aldaba, MD

## 2014-10-16 NOTE — Patient Instructions (Signed)

## 2014-10-22 NOTE — Telephone Encounter (Signed)
See other phone message  

## 2015-02-12 ENCOUNTER — Emergency Department (HOSPITAL_COMMUNITY)
Admission: EM | Admit: 2015-02-12 | Discharge: 2015-02-12 | Disposition: A | Discharge status: Home / Self Care | Payer: BC Managed Care – PPO | Attending: Emergency Medicine | Admitting: Emergency Medicine

## 2015-02-12 ENCOUNTER — Encounter (HOSPITAL_COMMUNITY): Payer: Self-pay

## 2015-02-12 ENCOUNTER — Emergency Department (HOSPITAL_COMMUNITY): Payer: BC Managed Care – PPO

## 2015-02-12 ENCOUNTER — Ambulatory Visit (INDEPENDENT_AMBULATORY_CARE_PROVIDER_SITE_OTHER): Payer: BC Managed Care – PPO | Admitting: Emergency Medicine

## 2015-02-12 VITALS — BP 114/68 | HR 90 | Temp 98.9°F | Resp 17 | Ht 65.0 in | Wt 113.0 lb

## 2015-02-12 DIAGNOSIS — Z3202 Encounter for pregnancy test, result negative: Secondary | ICD-10-CM | POA: Diagnosis not present

## 2015-02-12 DIAGNOSIS — Z79818 Long term (current) use of other agents affecting estrogen receptors and estrogen levels: Secondary | ICD-10-CM | POA: Diagnosis not present

## 2015-02-12 DIAGNOSIS — Z79899 Other long term (current) drug therapy: Secondary | ICD-10-CM | POA: Diagnosis not present

## 2015-02-12 DIAGNOSIS — Z91040 Latex allergy status: Secondary | ICD-10-CM | POA: Insufficient documentation

## 2015-02-12 DIAGNOSIS — M940 Chondrocostal junction syndrome [Tietze]: Secondary | ICD-10-CM | POA: Insufficient documentation

## 2015-02-12 DIAGNOSIS — R079 Chest pain, unspecified: Secondary | ICD-10-CM

## 2015-02-12 DIAGNOSIS — N912 Amenorrhea, unspecified: Secondary | ICD-10-CM | POA: Diagnosis not present

## 2015-02-12 DIAGNOSIS — Z8679 Personal history of other diseases of the circulatory system: Secondary | ICD-10-CM | POA: Diagnosis not present

## 2015-02-12 DIAGNOSIS — R9431 Abnormal electrocardiogram [ECG] [EKG]: Secondary | ICD-10-CM

## 2015-02-12 DIAGNOSIS — J45909 Unspecified asthma, uncomplicated: Secondary | ICD-10-CM | POA: Insufficient documentation

## 2015-02-12 LAB — POCT URINE PREGNANCY: Preg Test, Ur: NEGATIVE

## 2015-02-12 LAB — I-STAT BETA HCG BLOOD, ED (MC, WL, AP ONLY): I-stat hCG, quantitative: <5 m[IU]/mL (ref <5)

## 2015-02-12 LAB — D-DIMER, QUANTITATIVE: D-Dimer, Quant: 0.31 ug/mL-FEU (ref 0.00–0.48)

## 2015-02-12 LAB — BASIC METABOLIC PANEL
Anion gap: 10 (ref 5–15)
BUN: 12 mg/dL (ref 6–20)
CO2: 25 mmol/L (ref 22–32)
Calcium: 9.9 mg/dL (ref 8.9–10.3)
Chloride: 104 mmol/L (ref 101–111)
Creatinine, Ser: 0.74 mg/dL (ref 0.44–1.00)
GFR calc Af Amer: >60 mL/min (ref >60)
GFR calc non Af Amer: >60 mL/min (ref >60)
Glucose, Bld: 136 mg/dL — ABNORMAL HIGH (ref 65–99)
Potassium: 3.6 mmol/L (ref 3.5–5.1)
Sodium: 139 mmol/L (ref 135–145)

## 2015-02-12 LAB — CBC
HCT: 44.4 % (ref 36.0–46.0)
Hemoglobin: 15.3 g/dL — ABNORMAL HIGH (ref 12.0–15.0)
MCH: 31.1 pg (ref 26.0–34.0)
MCHC: 34.5 g/dL (ref 30.0–36.0)
MCV: 90.2 fL (ref 78.0–100.0)
Platelets: 276 10*3/uL (ref 150–400)
RBC: 4.92 MIL/uL (ref 3.87–5.11)
RDW: 11.6 % (ref 11.5–15.5)
WBC: 8.9 10*3/uL (ref 4.0–10.5)

## 2015-02-12 LAB — I-STAT TROPONIN, ED: Troponin i, poc: 0 ng/mL (ref 0.00–0.08)

## 2015-02-12 MED ORDER — HYDROMORPHONE HCL 1 MG/ML IJ SOLN: 1.0000 mg | Freq: Once | INTRAMUSCULAR | Status: DC

## 2015-02-12 MED ORDER — ONDANSETRON HCL 4 MG/2ML IJ SOLN: 4.0000 mg | Freq: Once | INTRAMUSCULAR | Status: DC

## 2015-02-12 NOTE — Discharge Instructions (Signed)
For pain control please take ibuprofen (also known as Motrin or Advil)  (this is normally 4 over the counter pills) 3 times a day  for 5 days. Take with food to minimize stomach irritation.  Do not do any aerobic activity until you are cleared by cardiology.  Please follow with your primary care doctor in the next 2 days for a check-up. They must obtain records for further management.   Do not hesitate to return to the Emergency Department for any new, worsening or concerning symptoms.    Chest Wall Pain Chest wall pain is pain felt in or around the chest bones and muscles. It may take up to 6 weeks to get better. It may take longer if you are active. Chest wall pain can happen on its own. Other times, things like germs, injury, coughing, or exercise can cause the pain. HOME CARE   Avoid activities that make you tired or cause pain. Try not to use your chest, belly (abdominal), or side muscles. Do not use heavy weights.  Put ice on the sore area.  Put ice in a plastic bag.  Place a towel between your skin and the bag.  Leave the ice on for 15-20 minutes for the first 2 days.  Only take medicine as told by your doctor. GET HELP RIGHT AWAY IF:   You have more pain or are very uncomfortable.  You have a fever.  Your chest pain gets worse.  You have new problems.  You feel sick to your stomach (nauseous) or throw up (vomit).  You start to sweat or feel lightheaded.  You have a cough with mucus (phlegm).  You cough up blood. MAKE SURE YOU:   Understand these instructions.  Will watch your condition.  Will get help right away if you are not doing well or get worse. Document Released: 10/20/2007 Document Revised: 07/26/2011 Document Reviewed: 12/28/2010 [facility] Patient Information 2015 [place], [place]. This information is not intended to replace advice given to you by your health care provider. Make sure you discuss any questions you have with your health care  provider.

## 2015-02-12 NOTE — ED Notes (Signed)
Per EMS, pt from [facility].  Pt c/o chest pain off/on for "quite a while".  Today when she got up felt some heaviness.  Pt states went to school and then decided to go to urgent care.  Had EKG at [facility] and was sent for evaluation.  Vitals:  131/90, hr 90, resp 16, 100% ra.

## 2015-02-12 NOTE — Progress Notes (Addendum)
This chart was scribed for Collene Gobble, MD by Charline Bills, ED Scribe. The patient was seen in room 7. Patient's care was started at 12:57 PM.  Chief Complaint:  Chief Complaint  Patient presents with  . Chest Pain    of unknown origin   . Immunizations    flu shot    HPI: Amy Navarro is a 24 y.o. female who reports to [facility] today complaining of gradually improving, intermittent chest pain since this morning. Pt states that she woke up at 5 AM today with shooting chest pain and intense pressure. Pain is exacerbated with bending over and deep inspirations. She reports that she intermittently has shooting chest pains and diaphoresis with increased anxiety, but states that this pain feels different and has lasted longer than previous episodes. 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 She further reports associated mild SOB onset this morning. Pt is currently taking 20 mg Adderall and birth control pills. Pt denies leg pain, calf swelling, heartburn.  Past Medical History  Diagnosis Date  . Asthma   . Blood transfusion without reported diagnosis   . Raynaud disease    No past surgical history on file. Social History   Social History  . Marital Status: Single    Spouse Name: N/A  . Number of Children: N/A  . Years of Education: N/A   Social History Main Topics  . Smoking status: Never Smoker   . Smokeless tobacco: Never Used  . Alcohol Use: 3.0 - 4.8 oz/week    0-3 Glasses of wine, 5 Standard drinks or equivalent per week  . Drug Use: No  . Sexual Activity: Yes    Birth Control/ Protection: None, Pill   Other Topics Concern  . None   Social History Narrative   Family History  Problem Relation Age of Onset  . Hypertension Father   . Hyperlipidemia Father    Allergies  Allergen Reactions  . Latex Hives, Shortness Of Breath and Rash   . Guaifenesin Er Nausea And Vomiting   Prior to Admission medications   Medication Sig Start Date End Date Taking? Authorizing Provider  albuterol (PROVENTIL) (5 MG/ML) 0.5% nebulizer solution Take 2.5 mg by nebulization every 6 (six) hours as needed for wheezing or shortness of breath.   Yes Historical Provider, MD  ALPRAZolam Prudy Feeler) 0.5 MG tablet Take 0.5 mg by mouth at bedtime as needed for anxiety.   Yes Historical Provider, MD  amphetamine-dextroamphetamine (ADDERALL) 20 MG tablet Take 20 mg by mouth daily.   Yes Historical Provider, MD  norethindrone-ethinyl estradiol (NECON,BREVICON,MODICON) 0.5-35 MG-MCG tablet Take 1 tablet by mouth daily.   Yes Historical Provider, MD  fluticasone (FLONASE) 50 MCG/ACT nasal spray Place 2 sprays into both nostrils daily. Patient not taking: Reported on 02/12/2015 10/16/14   Collene Gobble, MD  Fluticasone-Salmeterol (ADVAIR) 100-50 MCG/DOSE AEPB Inhale 1 puff into the lungs every 12 (twelve) hours.    Historical Provider, MD     ROS: The patient denies fevers, chills, night sweats, unintentional weight loss, palpitations, wheezing, leg swelling, nausea, vomiting, abdominal pain, dysuria, hematuria, melena, numbness, weakness, or tingling. +chest pain, +SOB  All other systems have been reviewed and were otherwise negative with the exception of those mentioned in the HPI and as above.    PHYSICAL EXAM: Filed Vitals:   02/12/15 1154  BP:  114/68  Pulse: 90  Temp: 98.9 F (37.2 C)  Resp: 17   Body mass index is 18.8 kg/(m^2).   General: Alert, no acute distress HEENT:  Normocephalic, atraumatic, oropharynx patent. Eye: Nonie Hoyer [facility] Cardiovascular: Mitral click. Regular rate and rhythm, no rubs, murmurs or gallops. No Carotid bruits, radial pulse intact. No pedal edema.  Respiratory/Chest: Clear to auscultation bilaterally. No wheezes, rales, or rhonchi. No cyanosis. Minimal chest wall tenderness on the L. Abdominal: No organomegaly, abdomen is  soft and non-tender, positive bowel sounds. No masses. Musculoskeletal: Gait intact. No edema. No calf tenderness or swelling.  Skin: No rashes. Neurologic: Facial musculature symmetric. Psychiatric: Patient acts appropriately throughout our interaction. Lymphatic: No cervical or submandibular lymphadenopathy  LABS: Results for orders placed or performed in visit on 02/12/15  POCT urine pregnancy  Result Value Ref Range   Preg Test, Ur Negative Negative  EKG/XRAY:   Primary read interpreted by Dr. Cleta Alberts at [facility]. 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 I suspect the T-wave inversion is a persistent juvenile pattern but I do not have an old EKG to compare.   ASSESSMENT/PLAN: Patient presents with 5-6 hours of substernal chest pain. The pain is gradually improved over the day. Her pain is morning was 7-8 intensity. She persists in having a shooting type pain in the precordium which is about a 6. I would consider d-dimer and troponin. She is on birth control pills. She did not have a chest x-ray prior to leaving our office. her pregnancy test is negative so she can go ahead with chest x-ray rescanning if needed. She does also have a short PR interval and will need cardiology follow-up once she is discharged.  By signing my name below, I, [name], attest that this documentation has been prepared under the direction and in the presence of Collene Gobble, MD Electronically Signed: Charline Bills, Medical Scribe 02/12/2015 at 12:57 PM.  Michaell Cowing sideeffects, risk and benefits, and alternatives of medications d/w patient. Patient is aware that all medications have potential sideeffects and we are unable to predict every sideeffect or drug-drug interaction that may occur.  Lesle Chris MD 02/12/2015 12:57 PM

## 2015-02-12 NOTE — ED Provider Notes (Signed)
CSN: [id number]     Arrival date & time 02/12/15  1409 History   First Navarro Initiated Contact with Patient 02/12/15 1617     Chief Complaint  Patient presents with  . Chest Pain     (Consider location/radiation/quality/duration/timing/severity/associated sxs/prior Treatment) HPI   Blood pressure 128/86, pulse 80, temperature 98.1 F (36.7 C), temperature source Oral, resp. rate 17, last menstrual period 01/29/2015, SpO2 100 %.  Amy Navarro is a 24 y.o. female with past medical history significant for asthma and blood transfusion as an infant complaining of chest pain intermittently over the course of 6 years, she has diffuse anterior bilateral chest pain today which is much more severe than typical and associated with pleuritic chest pain. This is atypical in her normal chest pain. Patient was seen by her primary care physician, there was a concern that there were T-wave inversions in the anterior lead, is also slightly shorter than normal. No prior to compare to. Patient denies family history of early cardiac death, cocaine or methamphetamine, history of DVT or PE, recent trips or mobilizations. Calf pain or leg swelling, fever, chills, hemoptysis, cough, shortness of breath, syncope. However patient states that when she was exercising she did feel lightheaded. Patient recently started grad school and is slightly more stress than normal.  Past Medical History  Diagnosis Date  . Asthma   . Blood transfusion without reported diagnosis   . Raynaud disease    History reviewed. No pertinent past surgical history. Family History  Problem Relation Age of Onset  . Hypertension Father   . Hyperlipidemia Father    Social History  Substance Use Topics  . Smoking status: Never Smoker   . Smokeless tobacco: Never Used  . Alcohol Use: 3.0 - 4.8 oz/week    0-3 Glasses of wine, 5 Standard drinks or equivalent per week   OB History    No data available     Review of Systems  10 systems  reviewed and found to be negative, except as noted in the HPI.  Allergies  Latex and Guaifenesin er  Home Medications   Prior to Admission medications   Medication Sig Start Date End Date Taking? Authorizing Amy Navarro  albuterol (PROVENTIL) (5 MG/ML) 0.5% nebulizer solution Take 2.5 mg by nebulization every 6 (six) hours as needed for wheezing or shortness of breath.   Yes Historical Amy Boutelle, Navarro  ALPRAZolam Prudy Feeler) 0.5 MG tablet Take 0.5 mg by mouth at bedtime as needed for anxiety.   Yes Historical Amy Nham, Navarro  amphetamine-dextroamphetamine (ADDERALL) 20 MG tablet Take 20 mg by mouth daily.   Yes Historical Amy Hartmann, Navarro  norethindrone-ethinyl estradiol (NECON,BREVICON,MODICON) 0.5-35 MG-MCG tablet Take 1 tablet by mouth daily.   Yes Historical Amy Moss, Navarro  fluticasone (FLONASE) 50 MCG/ACT nasal spray Place 2 sprays into both nostrils daily. Patient not taking: Reported on 02/12/2015 10/16/14   Collene Gobble, Navarro   BP 128/86 mmHg  Pulse 80  Temp(Src) 98.1 F (36.7 C) (Oral)  Resp 17  SpO2 100%  LMP 01/29/2015 Physical Exam  Constitutional: She is oriented to person, place, and time. She appears well-developed and well-nourished. No distress.  HENT:  Head: Normocephalic.  Mouth/Throat: Oropharynx is clear and moist.  Eyes: Conjunctivae are normal.  Neck: Normal range of motion. No JVD present. No tracheal deviation present.  Cardiovascular: Normal rate, regular rhythm and intact distal pulses.   Radial pulse equal bilaterally  Pulmonary/Chest: Effort normal and breath sounds normal. No stridor. No respiratory distress. She has no wheezes.  She has no rales. She exhibits tenderness.  Chest pain is reproducible to palpation along the anterior  Abdominal: Soft. She exhibits no distension and no mass. There is no tenderness. There is no rebound and no guarding.  Musculoskeletal: Normal range of motion. She exhibits no edema or tenderness.  No calf asymmetry, superficial collaterals,  palpable cords, edema, Homans sign negative bilaterally.    Neurological: She is alert and oriented to person, place, and time.  Skin: Skin is warm. She is not diaphoretic.  Psychiatric: She has a normal mood and affect.  Nursing note and vitals reviewed.   ED Course  Procedures (including critical care time) Labs Review Labs Reviewed  BASIC METABOLIC PANEL - Abnormal; Notable for the following:    Glucose, Bld 136 (*)    All other components within normal limits  CBC - Abnormal; Notable for the following:    Hemoglobin 15.3 (*)    All other components within normal limits  D-DIMER, QUANTITATIVE (NOT AT [facility])  I-STAT BETA HCG BLOOD, ED ([facility], [facility], [facility] ONLY)  I-STAT TROPOININ, ED    Imaging Review Dg Chest 2 View  02/12/2015   CLINICAL DATA:  Non radiating upper chest pain. Shortness of breath. Asthma.  EXAM: CHEST  2 VIEW  COMPARISON:  None.  FINDINGS: Severe hyperinflation. No pulmonary infiltrate or failure. No pleural effusion or pneumothorax. Normal heart size. No osseous abnormality.  IMPRESSION: Severe hyperinflation.  No active disease.   Electronically Signed   By: Elsie Stain M.D.   On: 02/12/2015 14:58   I have personally reviewed and evaluated these images and lab results as part of my medical decision-making.   EKG Interpretation   Date/Time:  Wednesday February 12 2015 14:21:29 EDT Ventricular Rate:  82 PR Interval:  122 QRS Duration: 79 QT Interval:  370 QTC Calculation: 432 R Axis:   78 Text Interpretation:  Sinus rhythm isolated flipped t wave in lead v3 No  old tracing to compare Confirmed by Amy Navarro, Amy Navarro [phone]) on 02/12/2015  5:11:09 PM      MDM   Final diagnoses:  Costochondritis  Shortened PR interval    Filed Vitals:   02/12/15 1426  BP: 128/86  Pulse: 80  Temp: 98.1 F (36.7 C)  TempSrc: Oral  Resp: 17  SpO2: 100%     Amy Navarro is a pleasant 24 y.o. female presenting with pressure-like anterior chest pain that is  reproducible to palpation. Patient has isolated T-wave inversion. Vital signs normal, chest x-ray without infiltrate. No family history of early cardiac death. She does have a slightly shorter than normal PR interval. D-dimer negative and no anemia seen on CBC. Triage initiated troponin negative. No family history of early cardiac death. Patient states that she felt lightheaded while exercising. Will advise patient not to do any cardiac activity until she is fully evaluated by cardiology.  This is a shared visit with the attending physician who personally evaluated the patient and agrees with the care plan.   Evaluation does not show pathology that would require ongoing emergent intervention or inpatient treatment. Pt is hemodynamically stable and mentating appropriately. Discussed findings and plan with patient/guardian, who agrees with care plan. All questions answered. Return precautions discussed and outpatient follow up given.  Joni Reining Pisciotta, PA-C 02/12/15 1752  Melene Plan, DO 02/12/15 2311

## 2015-02-15 DIAGNOSIS — R011 Cardiac murmur, unspecified: Secondary | ICD-10-CM | POA: Insufficient documentation

## 2015-02-16 ENCOUNTER — Telehealth: Payer: Self-pay | Admitting: Radiology

## 2015-02-16 NOTE — Telephone Encounter (Signed)
Patient has requested a note for school for time missed last week. She was evaluated and sent to ER she will pick up the note I have provided.

## 2015-02-19 ENCOUNTER — Ambulatory Visit (INDEPENDENT_AMBULATORY_CARE_PROVIDER_SITE_OTHER): Payer: BC Managed Care – PPO | Admitting: Family Medicine

## 2015-02-19 VITALS — BP 120/70 | HR 76 | Temp 97.9°F | Resp 16 | Ht 65.0 in | Wt 112.0 lb

## 2015-02-19 DIAGNOSIS — R591 Generalized enlarged lymph nodes: Secondary | ICD-10-CM

## 2015-02-19 DIAGNOSIS — R05 Cough: Secondary | ICD-10-CM

## 2015-02-19 DIAGNOSIS — J0100 Acute maxillary sinusitis, unspecified: Secondary | ICD-10-CM | POA: Diagnosis not present

## 2015-02-19 DIAGNOSIS — R0981 Nasal congestion: Secondary | ICD-10-CM

## 2015-02-19 DIAGNOSIS — R634 Abnormal weight loss: Secondary | ICD-10-CM | POA: Diagnosis not present

## 2015-02-19 DIAGNOSIS — R5383 Other fatigue: Secondary | ICD-10-CM | POA: Diagnosis not present

## 2015-02-19 DIAGNOSIS — R059 Cough, unspecified: Secondary | ICD-10-CM

## 2015-02-19 LAB — T3, FREE: T3, Free: 3 pg/mL (ref 2.3–4.2)

## 2015-02-19 LAB — TSH: TSH: 4.167 u[IU]/mL (ref 0.350–4.500)

## 2015-02-19 LAB — T4, FREE: Free T4: 1.19 ng/dL (ref 0.80–1.80)

## 2015-02-19 MED ORDER — BENZONATATE 100 MG PO CAPS: 200.0000 mg | ORAL_CAPSULE | Freq: Two times a day (BID) | ORAL | Status: DC | PRN

## 2015-02-19 NOTE — Progress Notes (Signed)
Chief Complaint:  Chief Complaint  Patient presents with  . Sinusitis    ONset over a week  . Cough  . Weight Loss    Onset 2-3 months    HPI: Amy Navarro is a 24 y.o. female who reports to [facility] today complaining of cough, facial pain, sinus xs, yellow and green productive cough, her classmate had it as well. 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 Occupational therapy. She is tired and has bene losing weight, has a hx of mono, nausea, no diarrhea, she feels tired all the time, however feels stress at school and at home minimal. Normally 118 lbs but now down to 110. She is on adderall no problems in the past with this.   Wt Readings from Last 3 Encounters:  02/19/15 112 lb (50.803 kg)  02/12/15 113 lb (51.256 kg)  10/16/14 115 lb (52.164 kg)     Past Medical History  Diagnosis Date  . Asthma   . Blood transfusion without reported diagnosis   . Raynaud disease    No past surgical history on file. Social History   Social History  . Marital Status: Single    Spouse Name: N/A  . Number of Children: N/A  . Years of Education: N/A   Social History Main Topics  . Smoking status: Never Smoker   . Smokeless tobacco: Never Used  . Alcohol Use: 3.0 - 4.8 oz/week    0-3 Glasses of wine, 5 Standard drinks or equivalent per week  . Drug Use: No  . Sexual Activity: Yes    Birth Control/ Protection: None, Pill   Other Topics Concern  . None   Social History Narrative   Family History  Problem Relation Age of Onset  . Hypertension Father   . Hyperlipidemia Father    Allergies  Allergen Reactions  . Latex Hives, Shortness Of Breath and Rash  . Guaifenesin Er Nausea And Vomiting   Prior to Admission medications   Medication Sig Start Date End Date Taking? Authorizing Provider  albuterol (PROVENTIL) (5 MG/ML) 0.5% nebulizer solution Take 2.5 mg by nebulization every 6 (six) hours as needed for wheezing or shortness  of breath.   Yes Historical Provider, MD  ALPRAZolam Prudy Feeler) 0.5 MG tablet Take 0.5 mg by mouth at bedtime as needed for anxiety.   Yes Historical Provider, MD  amphetamine-dextroamphetamine (ADDERALL) 20 MG tablet Take 20 mg by mouth daily.   Yes Historical Provider, MD  fluticasone (FLONASE) 50 MCG/ACT nasal spray Place 2 sprays into both nostrils daily. 10/16/14  Yes Collene Gobble, MD  Fluticasone-Salmeterol (ADVAIR) 100-50 MCG/DOSE AEPB Inhale 1 puff into the lungs 2 (two) times daily.   Yes Historical Provider, MD  norethindrone-ethinyl estradiol (NECON,BREVICON,MODICON) 0.5-35 MG-MCG tablet Take 1 tablet by mouth daily.   Yes Historical Provider, MD     ROS: The patient denies night sweats, chest pain, palpitations, wheezing, dyspnea on exertion, nausea, vomiting, abdominal pain, dysuria, hematuria, melena, numbness  All other systems have been reviewed and were otherwise negative with the exception of those mentioned in the HPI and as above.    PHYSICAL EXAM: Filed Vitals:   02/19/15 1653  BP: 120/70  Pulse: 76  Temp: 97.9 F (36.6 C)  Resp: 16   Body mass index is 18.64 kg/(m^2).   General: Alert, no acute distress HEENT:  Normocephalic, atraumatic, oropharynx patent. EOMI, PERRLA Erythematous throat, no exudates, TM  normal, + sinus tenderness, + erythematous/boggy nasal mucosa Cardiovascular:  Regular rate and rhythm, no rubs murmurs or gallops.  No Carotid bruits, radial pulse intact. No pedal edema.  Respiratory: Clear to auscultation bilaterally.  No wheezes, rales, or rhonchi.  No cyanosis, no use of accessory musculature Abdominal: No organomegaly, abdomen is soft and non-tender, positive bowel sounds. No masses. Skin: No rashes. Neurologic: Facial musculature symmetric. Psychiatric: Patient acts appropriately throughout our interaction. Lymphatic:+ left anterior LAD cervical ; no submandibular lymphadenopathy Musculoskeletal: Gait intact. No edema,  tenderness    LABS: Results for orders placed or performed during the hospital encounter of 02/12/15  Basic metabolic panel  Result Value Ref Range   Sodium 139 135 - 145 mmol/L   Potassium 3.6 3.5 - 5.1 mmol/L   Chloride 104 101 - 111 mmol/L   CO2 25 22 - 32 mmol/L   Glucose, Bld 136 (H) 65 - 99 mg/dL   BUN 12 6 - 20 mg/dL   Creatinine, Ser 8.29 0.44 - 1.00 mg/dL   Calcium 9.9 8.9 - 56.2 mg/dL   GFR calc non Af Amer >60 >60 mL/min   GFR calc Af Amer >60 >60 mL/min   Anion gap 10 5 - 15  CBC  Result Value Ref Range   WBC 8.9 4.0 - 10.5 K/uL   RBC 4.92 3.87 - 5.11 MIL/uL   Hemoglobin 15.3 (H) 12.0 - 15.0 g/dL   HCT 13.0 86.5 - 78.4 %   MCV 90.2 78.0 - 100.0 fL   MCH 31.1 26.0 - 34.0 pg   MCHC 34.5 30.0 - 36.0 g/dL   RDW 69.6 29.5 - 28.4 %   Platelets 276 150 - 400 K/uL  D-dimer, quantitative (not at [facility])  Result Value Ref Range   D-Dimer, Quant 0.31 0.00 - 0.48 ug/mL-FEU  I-Stat Beta hCG blood, ED ([facility], [facility], [facility] only)  Result Value Ref Range   I-stat hCG, quantitative <5.0 <5 mIU/mL   Comment 3          I-stat troponin, ED  Result Value Ref Range   Troponin i, poc 0.00 0.00 - 0.08 ng/mL   Comment 3             EKG/XRAY:   Primary read interpreted by Dr. Conley Rolls at [facility].   ASSESSMENT/PLAN: Encounter Diagnoses  Name Primary?  . Nasal congestion Yes  . Loss of weight   . Acute maxillary sinusitis, recurrence not specified   . LAD (lymphadenopathy)   . Other fatigue   . Cough    Labs pending Will call in abx if labs normal  Fu prn , she has cardiology appt soon, recently seen here for palpitations and chest pain, no sxs of that kind currently.    Gross sideeffects, risk and benefits, and alternatives of medications d/w patient. Patient is aware that all medications have potential sideeffects and we are unable to predict every sideeffect or drug-drug interaction that may occur.  Perlie Stene DO  02/19/2015 5:26 PM

## 2015-02-20 ENCOUNTER — Telehealth: Payer: Self-pay | Admitting: Family Medicine

## 2015-02-20 DIAGNOSIS — J0100 Acute maxillary sinusitis, unspecified: Secondary | ICD-10-CM

## 2015-02-20 LAB — EPSTEIN-BARR VIRUS VCA ANTIBODY PANEL
EBV EA IgG: <5 U/mL (ref <9.0)
EBV NA IgG: 340 U/mL — ABNORMAL HIGH (ref <18.0)
EBV VCA IgG: 70.1 U/mL — ABNORMAL HIGH (ref <18.0)
EBV VCA IgM: <10 U/mL (ref <36.0)

## 2015-02-20 MED ORDER — AMOXICILLIN-POT CLAVULANATE 875-125 MG PO TABS: 1.0000 | ORAL_TABLET | Freq: Two times a day (BID) | ORAL | Status: DC

## 2015-02-20 NOTE — Telephone Encounter (Signed)
Patient is requesting antibiotic.  [phone]

## 2015-02-20 NOTE — Telephone Encounter (Signed)
Spoke with patient about labs, will rx augmentin

## 2015-02-21 ENCOUNTER — Ambulatory Visit (INDEPENDENT_AMBULATORY_CARE_PROVIDER_SITE_OTHER): Payer: BC Managed Care – PPO | Admitting: Cardiology

## 2015-02-21 ENCOUNTER — Ambulatory Visit: Payer: BC Managed Care – PPO | Admitting: Cardiology

## 2015-02-21 VITALS — BP 110/64 | HR 80 | Ht 65.0 in | Wt 113.4 lb

## 2015-02-21 DIAGNOSIS — R42 Dizziness and giddiness: Secondary | ICD-10-CM | POA: Diagnosis not present

## 2015-02-21 DIAGNOSIS — R079 Chest pain, unspecified: Secondary | ICD-10-CM | POA: Diagnosis not present

## 2015-02-21 DIAGNOSIS — R011 Cardiac murmur, unspecified: Secondary | ICD-10-CM

## 2015-02-21 DIAGNOSIS — R202 Paresthesia of skin: Secondary | ICD-10-CM | POA: Diagnosis not present

## 2015-02-21 DIAGNOSIS — R0600 Dyspnea, unspecified: Secondary | ICD-10-CM

## 2015-02-21 DIAGNOSIS — R0609 Other forms of dyspnea: Secondary | ICD-10-CM

## 2015-02-21 NOTE — Progress Notes (Signed)
Patient ID: Amy Navarro, female   DOB: 01-09-1991, 24 y.o.   MRN: [id number]      Cardiology Office Note   Date:  02/21/2015   ID:  Amy Navarro, DOB 10-16-1990, MRN [id number]  PCP:  Lucilla Edin, MD  Cardiologist:  Lars Masson, MD     History of Present Illness: [name] is a 24 y.o. female who presents for evaluation of postexertional dizziness and chest pain. The patient is a IT consultant. She states that for a long time she has noticed postexertional dizziness, bilateral lower extremities tingling, fullness and erythema in her face As a result she stopped cross country running. Her sister who is an identical twin had the same symptoms. They were 11 weeks premature when born. She is also experiencing sharp chest pains not related to exertion. No syncope. No orthopnea, PND, LE edema.   Past Medical History  Diagnosis Date  . Asthma   . Blood transfusion without reported diagnosis   . Raynaud disease    No past surgical history on file.  Current Outpatient Prescriptions  Medication Sig Dispense Refill  . albuterol (PROVENTIL) (5 MG/ML) 0.5% nebulizer solution Take 2.5 mg by nebulization every 6 (six) hours as needed for wheezing or shortness of breath.    . ALPRAZolam (XANAX) 0.5 MG tablet Take 0.5 mg by mouth at bedtime as needed for anxiety.    Marland Kitchen amoxicillin-clavulanate (AUGMENTIN) 875-125 MG tablet Take 1 tablet by mouth 2 (two) times daily. 20 tablet 0  . amphetamine-dextroamphetamine (ADDERALL) 20 MG tablet Take 20 mg by mouth daily.    . benzonatate (TESSALON) 100 MG capsule Take 2 capsules (200 mg total) by mouth 2 (two) times daily as needed. 30 capsule 1  . fluticasone (FLONASE) 50 MCG/ACT nasal spray Place 2 sprays into both nostrils daily. 16 g 6  . Fluticasone-Salmeterol (ADVAIR) 100-50 MCG/DOSE AEPB Inhale 1 puff into the lungs 2 (two) times daily.    . norethindrone-ethinyl estradiol (NECON,BREVICON,MODICON) 0.5-35 MG-MCG tablet Take 1 tablet by  mouth daily.     No current facility-administered medications for this visit.   Allergies:   Latex and Guaifenesin er   Social History:  The patient  reports that she has never smoked. She has never used smokeless tobacco. She reports that she drinks about 3.0 - 4.8 oz of alcohol per week. She reports that she does not use illicit drugs.   Family History:  The patient's family history includes Hyperlipidemia in her father; Hypertension in her father.   ROS:  Please see the history of present illness.  All other systems are reviewed and negative.   PHYSICAL EXAM: VS:  BP 110/64 mmHg  Pulse 80  Ht  (1.651 m)  Wt 113 lb 6.4 oz (51.438 kg)  BMI 18.87 kg/m2  LMP 01/29/2015 , BMI Body mass index is 18.87 kg/(m^2). GEN: Well nourished, well developed, in no acute distress HEENT: normal Neck: no JVD, carotid bruits, or masses Cardiac: RRR; 3/6 systolic murmur in the left upper chest, rubs, or gallops,no edema  Respiratory:  clear to auscultation bilaterally, normal work of breathing GI: soft, nontender, nondistended, + BS MS: no deformity or atrophy Skin: warm and dry, no rash Neuro:  Strength and sensation are intact Psych: euthymic mood, full affect  EKG:  SR, normal ECG  Recent Labs: 10/02/2014: ALT 12 02/12/2015: BUN 12; Creatinine, Ser 0.74; Hemoglobin 15.3*; Platelets 276; Potassium 3.6; Sodium 139 02/19/2015: TSH 4.167   Lipid Panel    Component Value  Date/Time   CHOL 231* 10/02/2014 1702   TRIG 96 10/02/2014 1702   HDL 74 10/02/2014 1702   CHOLHDL 3.1 10/02/2014 1702   VLDL 19 10/02/2014 1702   LDLCALC 138* 10/02/2014 1702   Wt Readings from Last 3 Encounters:  02/21/15 113 lb 6.4 oz (51.438 kg)  02/19/15 112 lb (50.803 kg)  02/12/15 113 lb (51.256 kg)      ASSESSMENT AND PLAN:  1.  Postexertional dizziness, Lower extremity tingling, weakness, murmur - we will order an echocardiogram to evaluate for a possible coarctation, anomalous coronary, any other  congenital defect. We will also order an exercise treadmill stress test.   2. Chest pain - as above.    Follow up in 6 weeks.   Signed, Lars Masson, MD  02/21/2015 9:08 AM    [facility] [address] [place], [place], [place]  [address] Phone: [phone]; Fax: [phone]

## 2015-02-21 NOTE — Patient Instructions (Signed)
Medication Instructions:  Your physician recommends that you continue on your current medications as directed. Please refer to the Current Medication list given to you today.  Labwork: NONE  Testing/Procedures: Your physician has requested that you have an echocardiogram. Echocardiography is a painless test that uses sound waves to create images of your heart. It provides your doctor with information about the size and shape of your heart and how well your heart's chambers and valves are working. This procedure takes approximately one hour. There are no restrictions for this procedure. WITH  VANESSA ONLY PER DR NELSON Your physician has requested that you have an exercise tolerance test. For further information please visit [email or web address] Please also follow instruction sheet, as given.    Follow-Up: Your physician recommends that you schedule a follow-up appointment in:  6 WEEKS WITH DR Delton See  Any Other Special Instructions Will Be Listed Below (If Applicable).

## 2015-03-12 ENCOUNTER — Encounter: Payer: BC Managed Care – PPO | Admitting: Nurse Practitioner

## 2015-03-12 ENCOUNTER — Other Ambulatory Visit (HOSPITAL_COMMUNITY): Payer: BC Managed Care – PPO

## 2015-03-19 ENCOUNTER — Encounter: Payer: BC Managed Care – PPO | Admitting: Nurse Practitioner

## 2015-03-19 ENCOUNTER — Ambulatory Visit (INDEPENDENT_AMBULATORY_CARE_PROVIDER_SITE_OTHER): Payer: BC Managed Care – PPO

## 2015-03-19 ENCOUNTER — Ambulatory Visit (HOSPITAL_COMMUNITY): Payer: BC Managed Care – PPO | Attending: Cardiology

## 2015-03-19 ENCOUNTER — Other Ambulatory Visit: Payer: Self-pay

## 2015-03-19 DIAGNOSIS — R079 Chest pain, unspecified: Secondary | ICD-10-CM

## 2015-03-19 DIAGNOSIS — R42 Dizziness and giddiness: Secondary | ICD-10-CM | POA: Diagnosis not present

## 2015-03-19 LAB — EXERCISE TOLERANCE TEST
Estimated workload: 15.3 METS
Exercise duration (min): 13 min
Exercise duration (sec): 0 s
MPHR: 197 {beats}/min
Peak HR: 187 {beats}/min
Percent HR: 94 %
RPE: 14
Rest HR: 94 {beats}/min

## 2015-03-21 ENCOUNTER — Telehealth: Payer: Self-pay | Admitting: *Deleted

## 2015-03-21 DIAGNOSIS — Q251 Coarctation of aorta: Secondary | ICD-10-CM | POA: Insufficient documentation

## 2015-03-21 NOTE — Telephone Encounter (Signed)
Notified the pt that per Dr Delton SeeNelson her echo was normal, but she recommends that we order a thoracic aortic MRA (not cardiac MRI) to evaluate for aortic coarctation.  Informed the pt that I will place this order in the system and [facility] and call her back to schedule this image.  Informed the pt that [facility].  Pt verbalized understanding and agrees with this plan.

## 2015-03-21 NOTE — Telephone Encounter (Signed)
-----   Message from Lars MassonKatarina H Nelson, MD sent at 03/21/2015  2:36 PM EDT ----- Normal echocardiogram, please order thoracic aortic MRA (not cardiac MRI) to evaluate for aortic coarctation.

## 2015-04-04 ENCOUNTER — Ambulatory Visit (HOSPITAL_COMMUNITY)
Admission: RE | Admit: 2015-04-04 | Discharge: 2015-04-04 | Disposition: A | Discharge status: Home / Self Care | Payer: BC Managed Care – PPO | Source: Ambulatory Visit | Attending: Cardiology | Admitting: Cardiology

## 2015-04-04 DIAGNOSIS — Q251 Coarctation of aorta: Secondary | ICD-10-CM | POA: Insufficient documentation

## 2015-04-04 DIAGNOSIS — R202 Paresthesia of skin: Secondary | ICD-10-CM | POA: Insufficient documentation

## 2015-04-04 DIAGNOSIS — R42 Dizziness and giddiness: Secondary | ICD-10-CM | POA: Diagnosis not present

## 2015-04-04 DIAGNOSIS — R079 Chest pain, unspecified: Secondary | ICD-10-CM | POA: Diagnosis not present

## 2015-04-04 MED ORDER — GADOBENATE DIMEGLUMINE 529 MG/ML IV SOLN
15.0000 mL | Freq: Once | INTRAVENOUS | Status: AC | PRN
  Administered 2015-04-04: 15 mL via INTRAVENOUS

## 2015-04-08 ENCOUNTER — Telehealth: Payer: Self-pay

## 2015-04-08 NOTE — Telephone Encounter (Signed)
PATIENT'S MOTHER (JENNIFER Wrigley) WOULD LIKE DR. DAUB TO CALL HER BACK REGARDING Jalesha'S UPDATE. SHE SAID DR. DAUB SAW HER DAUGHTER ABOUT 6 WEEKS AGO. SHE WAS SENT TO THE HOSPITAL AND THEY DID MINIMAL TESTING ON HER THAT ALL CAME BACK NORMAL. SHE WAS SENT TO A CARDIOLOGIST. PATIENT'S MOTHER WOULD LIKE DR. DAUB TO REFER HER TO SEE A RHEUMATOLOGIST HOPEFULLY AS SOON AS POSSIBLE BECAUSE OF HER SEVERE RAYNAUD'S DISEASE. SHE WOULD LIKE TO TALK TO DR. DAUB SPECIFICALLY BECAUSE SHE SAID HE KNOWS HER AS WELL AS HER 2 DAUGHTER'S HISTORIES. SHE WILL BE ABLE TO TALK TO HIM MORE IN DETAIL ABOUT WHAT IS GOING ON WITH Daisha. BEST PHONE [phone] (JENNIFER Aylward) MBC

## 2015-04-11 ENCOUNTER — Other Ambulatory Visit: Payer: Self-pay | Admitting: Emergency Medicine

## 2015-04-11 DIAGNOSIS — I7300 Raynaud's syndrome without gangrene: Secondary | ICD-10-CM

## 2015-04-11 NOTE — Telephone Encounter (Signed)
Do you want them to come in?

## 2015-04-11 NOTE — Telephone Encounter (Signed)
I call patient's mom. Referral made to rheumatology.

## 2015-05-02 ENCOUNTER — Ambulatory Visit (INDEPENDENT_AMBULATORY_CARE_PROVIDER_SITE_OTHER): Payer: BC Managed Care – PPO | Admitting: Cardiology

## 2015-05-02 ENCOUNTER — Encounter: Payer: Self-pay | Admitting: Cardiology

## 2015-05-02 VITALS — BP 110/74 | HR 80 | Ht 65.0 in | Wt 114.0 lb

## 2015-05-02 DIAGNOSIS — R42 Dizziness and giddiness: Secondary | ICD-10-CM | POA: Diagnosis not present

## 2015-05-02 DIAGNOSIS — R072 Precordial pain: Secondary | ICD-10-CM | POA: Diagnosis not present

## 2015-05-02 DIAGNOSIS — R002 Palpitations: Secondary | ICD-10-CM | POA: Diagnosis not present

## 2015-05-02 NOTE — Patient Instructions (Signed)
Medication Instructions:   Your physician recommends that you continue on your current medications as directed. Please refer to the Current Medication list given to you today.    Testing/Procedures:  Your physician has recommended that you wear a 48 HOUR holter monitor. Holter monitors are medical devices that record the heart's electrical activity. Doctors most often use these monitors to diagnose arrhythmias. Arrhythmias are problems with the speed or rhythm of the heartbeat. The monitor is a small, portable device. You can wear one while you do your normal daily activities. This is usually used to diagnose what is causing palpitations/syncope (passing out).    Follow-Up:  Your physician recommends that you schedule a follow-up appointment in: AS NEEDED WITH DR Delton SeeNELSON     If you need a refill on your cardiac medications before your next appointment, please call your pharmacy.

## 2015-05-02 NOTE — Progress Notes (Addendum)
Patient ID: Amy Navarro, female   DOB: 01/19/1991, 24 y.o.   MRN: [id number]      Cardiology Office Note   Date:  05/02/2015   ID:  Amy Navarro, DOB 09/05/1990, MRN [id number]  PCP:  Amy EdinAUB, STEVE A, MD  Cardiologist:  Lars MassonNELSON, Aloysuis Ribaudo H, MD   Chief complain: follow up after MRI/MRA and echocardiography, postexertional dizziness   History of Present Illness: Amy Navarro is Navarro 24 y.o. female who presents for evaluation of postexertional dizziness and chest pain. The patient is Navarro IT consultantgrad student. She states that for Navarro long time she has noticed postexertional dizziness, bilateral lower extremities tingling, fullness and erythema in her face As Navarro result she stopped cross country running. Her sister who is an identical twin had the same symptoms. They were 11 weeks premature when born. She is also experiencing sharp chest pains not related to exertion. No syncope. No orthopnea, PND, LE edema.   05/02/15 - follow up, she continues to have symptoms of postexertional dizziness, no syncope, also chest pain with anxiety. No DOE. She describes frequent (every other day) palpitations associated with SOB, lasting about 10 minutes, starting of all Navarro sudden.  Past Medical History  Diagnosis Date  . Asthma   . Blood transfusion without reported diagnosis   . Raynaud disease    No past surgical history on file.  Current Outpatient Prescriptions  Medication Sig Dispense Refill  . albuterol (PROVENTIL) (5 MG/ML) 0.5% nebulizer solution Take 2.5 mg by nebulization every 6 (six) hours as needed for wheezing or shortness of breath.    . ALPRAZolam (XANAX) 0.5 MG tablet Take 0.5 mg by mouth at bedtime as needed for anxiety.    Marland Kitchen. amphetamine-dextroamphetamine (ADDERALL) 20 MG tablet Take 20 mg by mouth daily.    . Fluticasone-Salmeterol (ADVAIR) 100-50 MCG/DOSE AEPB Inhale 1 puff into the lungs 2 (two) times daily as needed.     . norethindrone-ethinyl estradiol (NECON,BREVICON,MODICON) 0.5-35 MG-MCG  tablet Take 1 tablet by mouth daily.     No current facility-administered medications for this visit.   Allergies:   Latex and Guaifenesin er   Social History:  The patient  reports that she has never smoked. She has never used smokeless tobacco. She reports that she drinks about 3.0 - 4.8 oz of alcohol per week. She reports that she does not use illicit drugs.   Family History:  The patient's family history includes Hyperlipidemia in her father; Hypertension in her father.   ROS:  Please see the history of present illness.  All other systems are reviewed and negative.   PHYSICAL EXAM: VS:  BP 110/74 mmHg  Pulse 80  Ht 5\' 5"  (1.651 m)  Wt 114 lb (51.71 kg)  BMI 18.97 kg/m2 , BMI Body mass index is 18.97 kg/(m^2). GEN: Well nourished, well developed, in no acute distress HEENT: normal Neck: no JVD, carotid bruits, or masses Cardiac: RRR; 3/6 systolic murmur in the left upper chest, rubs, or gallops,no edema  Respiratory:  clear to auscultation bilaterally, normal work of breathing GI: soft, nontender, nondistended, + BS MS: no deformity or atrophy Skin: warm and dry, no rash Neuro:  Strength and sensation are intact Psych: euthymic mood, full affect  EKG:  SR, normal ECG  Recent Labs: 10/02/2014: ALT 12 02/12/2015: BUN 12; Creatinine, Ser 0.74; Hemoglobin 15.3*; Platelets 276; Potassium 3.6; Sodium 139 02/19/2015: TSH 4.167   Lipid Panel    Component Value Date/Time   CHOL 231* 10/02/2014 1702   TRIG 96 10/02/2014  1702   HDL 74 10/02/2014 1702   CHOLHDL 3.1 10/02/2014 1702   VLDL 19 10/02/2014 1702   LDLCALC 138* 10/02/2014 1702   Wt Readings from Last 3 Encounters:  05/02/15 114 lb (51.71 kg)  02/21/15 113 lb 6.4 oz (51.438 kg)  02/19/15 112 lb (50.803 kg)    - TTE: 03/19/2015  Left ventricle: The cavity size was normal. Wall thickness was normal. Systolic function was normal. The estimated ejection fraction was in the range of 55% to 60%. Wall motion was  normal; there were no regional wall motion abnormalities. Left ventricular diastolic function parameters were normal.  Impressions: - Normal LV systolic and diastolic function; no significant valvular disease.  Chest MRA: IMPRESSION: 1. Unremarkable thoracic aortic MRA. No coarctation.    ASSESSMENT AND PLAN:  1.  Postexertional dizziness, Lower extremity tingling, weakness, murmur - echocardiogram was completely normal, including normal coronary origin, coarctation couldn't be assessed therefore underwent MRA that excluded coarctation. Exercise treadmill stress test was normal.  This seems to be Navarro case of postexercise vasodilataion, the patient is advised to hydrate well, use electrolytes like gatorade prior to exercise, and allow to slowly cool down post exercise not to stop abruptly.  2. Chest pain - most probably related to anxiety.  3. Palpitation - possible SVT, we will order Navarro 48 hour Holter monitor, if normal, Follow up PRN.  Signed, Lars Masson, MD  05/02/2015 4:33 PM    [facility] [address] [place], [place], [place]  [address] Phone: [phone]; Fax: [phone]

## 2015-05-06 ENCOUNTER — Ambulatory Visit: Payer: BC Managed Care – PPO

## 2015-05-08 ENCOUNTER — Encounter: Payer: Self-pay | Admitting: *Deleted

## 2015-05-08 NOTE — Progress Notes (Signed)
Patient ID: [name], female   DOB: 11/12/1990, 24 y.o.   MRN: [id number] Patient returned 48 hour holter monitor 05/07/16.  Notation in diary 4:51PM 05/06/16, "machine says- SD Card Error" .  No data saved on monitor.   [employer] called and they said it is a monitor malfunction.  The test will need to be repeated.   Patient will be rescheduled for another monitor to be applied.

## 2015-05-13 ENCOUNTER — Ambulatory Visit (INDEPENDENT_AMBULATORY_CARE_PROVIDER_SITE_OTHER): Payer: BC Managed Care – PPO

## 2015-05-13 DIAGNOSIS — R002 Palpitations: Secondary | ICD-10-CM | POA: Diagnosis not present

## 2015-06-20 ENCOUNTER — Ambulatory Visit (INDEPENDENT_AMBULATORY_CARE_PROVIDER_SITE_OTHER): Payer: BC Managed Care – PPO | Admitting: Internal Medicine

## 2015-06-20 VITALS — BP 118/68 | HR 94 | Temp 98.2°F | Resp 16 | Ht 66.0 in | Wt 114.0 lb

## 2015-06-20 DIAGNOSIS — R51 Headache: Secondary | ICD-10-CM

## 2015-06-20 DIAGNOSIS — R519 Headache, unspecified: Secondary | ICD-10-CM

## 2015-06-20 DIAGNOSIS — J069 Acute upper respiratory infection, unspecified: Secondary | ICD-10-CM

## 2015-06-20 MED ORDER — METRONIDAZOLE 500 MG PO TABS: 500.0000 mg | ORAL_TABLET | Freq: Two times a day (BID) | ORAL | Status: DC

## 2015-06-20 MED ORDER — MELOXICAM 15 MG PO TABS: 15.0000 mg | ORAL_TABLET | Freq: Every day | ORAL | Status: DC

## 2015-06-20 NOTE — Progress Notes (Addendum)
Subjective:  By signing my name below, I, [name], attest that this documentation has been prepared under the direction and in the presence of Tonye Pearson, MD Electronically Signed: Charline Bills, ED Scribe 06/20/2015 at 2:25 PM.    Patient ID: Amy Navarro, female    DOB: July 02, 1990, 25 y.o.   MRN: [id number]  Chief Complaint  Patient presents with  . Headache    off and on since sunday  . Nausea   HPI HPI Comments: Amy Navarro is a 25 y.o. female who [facility] complaining of intermittent occipital HA for the past 5 days. Pt noticed that HA improved with flexing her neck 5 days ago. She reports associated fatigue, sinus pressure, postnasal drip, sore throat onset today, nausea, night sweats that has resolved. She denies fever, cough, back pain and neck pain. Pt has received her flu vaccine. Pt takes Adderall in the mornings and occasionally notices HA when it wears off.   GU Pt has had intermittent yeast infections since January 2017 that she suspects started after being in a hot tub. She reports initial itching that has resolved but reports associated intermittent vaginal discharge that returns after completing antibiotics. Pt was last seen by her GYN 05/20/15.   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   Past Medical History  Diagnosis Date  . Asthma   . Blood transfusion without reported diagnosis   . Raynaud disease    Current Outpatient Prescriptions on File Prior to Visit  Medication Sig Dispense Refill  . albuterol (PROVENTIL) (5 MG/ML) 0.5% nebulizer solution Take 2.5 mg by nebulization every 6 (six) hours as needed for wheezing or shortness of breath.    . ALPRAZolam (XANAX) 0.5 MG tablet Take 0.5 mg by mouth at bedtime as needed for anxiety.    Marland Kitchen amphetamine-dextroamphetamine (ADDERALL) 20 MG tablet Take 20 mg by mouth daily.    . Fluticasone-Salmeterol (ADVAIR) 100-50 MCG/DOSE  AEPB Inhale 1 puff into the lungs 2 (two) times daily as needed.     . norethindrone-ethinyl estradiol (NECON,BREVICON,MODICON) 0.5-35 MG-MCG tablet Take 1 tablet by mouth daily.     No current facility-administered medications on file prior to visit.   Allergies  Allergen Reactions  . Latex Hives, Shortness Of Breath and Rash  . Guaifenesin Er Nausea And Vomiting   Review of Systems  Constitutional: Positive for fatigue. Negative for fever.  HENT: Positive for postnasal drip, sinus pressure and sore throat.   Respiratory: Negative for cough.   Gastrointestinal: Positive for nausea.  Genitourinary: Positive for vaginal discharge.  Musculoskeletal: Negative for back pain and neck pain.  Neurological: Positive for headaches.      Objective:   Physical Exam  Constitutional: She is oriented to person, place, and time. She appears well-developed and well-nourished. No distress.  HENT:  Head: Normocephalic and atraumatic.  TMs are normal. Nares boggy with clear mucus. Throat red without exudate. No cervical nodes.   Eyes: Conjunctivae and EOM are normal. Pupils are equal, round, and reactive to light.  Neck: Neck supple.  Cardiovascular: Normal rate.   Pulmonary/Chest: Effort normal. No respiratory distress.  Musculoskeletal: Normal range of motion.  Neurological: She is alert and oriented to person, place, and time. No cranial nerve deficit.  Skin: Skin is warm and dry.  Psychiatric: She has a normal mood and affect. Her behavior is normal.  Nursing note and vitals reviewed. BP 118/68 mmHg  Pulse 94  Temp(Src) 98.2 F (36.8 C)  Resp 16  Ht  (1.676 m)  Wt 114 lb (51.71 kg)  BMI 18.41 kg/m2  SpO2 99%  LMP 06/08/2015     Assessment & Plan:  Viral URI  Nonintractable headache, unspecified chronicity pattern, unspecified headache type  Vaginal d/c w/ sl odor recurring since rx gyn 05/2015 for yeast with rest of screen normal--will treat for NSV and eval if no response or  relapse  Otcs//melox//fu not well 5d I have completed the patient encounter in its entirety as documented by the scribe, with editing by me where necessary. Mardell Cragg P. Merla Riches, M.D.

## 2015-09-12 ENCOUNTER — Ambulatory Visit (INDEPENDENT_AMBULATORY_CARE_PROVIDER_SITE_OTHER): Payer: BC Managed Care – PPO | Admitting: Physician Assistant

## 2015-09-12 VITALS — BP 118/74 | HR 90 | Temp 98.2°F | Resp 16 | Ht 66.0 in | Wt 114.0 lb

## 2015-09-12 DIAGNOSIS — R079 Chest pain, unspecified: Secondary | ICD-10-CM | POA: Diagnosis not present

## 2015-09-12 DIAGNOSIS — Z0000 Encounter for general adult medical examination without abnormal findings: Secondary | ICD-10-CM

## 2015-09-12 DIAGNOSIS — Z111 Encounter for screening for respiratory tuberculosis: Secondary | ICD-10-CM | POA: Diagnosis not present

## 2015-09-12 LAB — POCT CBC
Granulocyte percent: 70.3 %G (ref 37–80)
HCT, POC: 44.4 % (ref 37.7–47.9)
HEMOGLOBIN: 15.6 g/dL (ref 12.2–16.2)
LYMPH, POC: 1.7 (ref 0.6–3.4)
MCH, POC: 31.9 pg — AB (ref 27–31.2)
MCHC: 35.2 g/dL (ref 31.8–35.4)
MCV: 90.5 fL (ref 80–97)
MID (cbc): 0.5 (ref 0–0.9)
POC Granulocyte: 5.3 (ref 2–6.9)
POC LYMPH PERCENT: 23 %L (ref 10–50)
POC MID %: 6.7 %M (ref 0–12)
Platelet Count, POC: 7.4 10*3/uL — AB (ref 142–424)
RBC: 4.91 M/uL (ref 4.04–5.48)
RDW, POC: 12.242 %
WBC: 7.5 10*3/uL (ref 4.6–10.2)

## 2015-09-12 NOTE — Progress Notes (Signed)
[facility] [address], [place] [address] [phone]  Date:  09/12/2015   Name:  Amy Navarro   DOB:  11/13/1990   MRN:  [id number]  PCP:  Lucilla EdinAUB, STEVE A, MD    Chief Complaint: Annual Exam and TB bloodwork   History of Present Illness:  This is a 25 y.o. female with PMH coarctation of the aorta, asthma, raynaud's who is presenting for CPE. 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  Pt had trouble with CP about 6 months ago. Post-exertional and at rest. She had assoc numbness in her feet and some blurred vision when it happened. She was seen by cardiology, Dr. Tobias AlexanderKatarina Nelson. CXR, ETT and MR angiogram chest all negative. She still has some CP but much better than prior. She notices more when she has her period.  Complaints: none LMP: 09/05/15 Contraception: on OCP Last pap: pap 05/2015 Sexual history: sexually active with men. States she had STD testing at GYN recently. Immunizations: Dentist: next appt in 1 week Eye: wears glasses with reading. No vision changes recently. Diet/Exercise: exercises 3 times a week. Very healthy diet. States she loves vegetables and fruits. States she drinks coffee and water during the day. Fam hx: dad HTN, Mom with autoimmune diseases, sister anorexia nervosa, grandfather with alzheimers Tobacco/alcohol/substance use: no/occ/no Sleeps generally ok - states she sleeps better in the winter compared to the summer States mood is good.  Review of Systems:  Review of Systems  Constitutional: Positive for fatigue.  HENT: Negative.   Eyes: Negative.   Respiratory: Negative.   Cardiovascular: Negative.   Gastrointestinal: Negative.   Endocrine: Negative.   Genitourinary: Negative.   Musculoskeletal: Negative.   Skin: Negative.   Allergic/Immunologic: Negative.   Neurological: Negative.   Hematological: Negative.    Psychiatric/Behavioral: Negative.     Patient Active Problem List   Diagnosis Date Noted  . Palpitations 05/02/2015  . Aortic coarctation 03/21/2015  . Exercise-induced asthma 04/13/2013  . Raynaud's disease 04/13/2013    Prior to Admission medications   Medication Sig Start Date End Date Taking? Authorizing Provider  albuterol (PROVENTIL) (5 MG/ML) 0.5% nebulizer solution Take 2.5 mg by nebulization every 6 (six) hours as needed for wheezing or shortness of breath.   Yes Historical Provider, MD  ALPRAZolam Prudy Feeler(XANAX) 0.5 MG tablet Take 0.5 mg by mouth at bedtime as needed for anxiety.   Yes Historical Provider, MD  amphetamine-dextroamphetamine (ADDERALL) 20 MG tablet Take 20 mg by mouth daily.   Yes Historical Provider, MD  Fluticasone-Salmeterol (ADVAIR) 100-50 MCG/DOSE AEPB Inhale 1 puff into the lungs 2 (two) times daily as needed.    Yes Historical Provider, MD  norethindrone-ethinyl estradiol (NECON,BREVICON,MODICON) 0.5-35 MG-MCG tablet Take 1 tablet by mouth daily.   Yes Historical Provider, MD    Allergies  Allergen Reactions  . Latex Hives, Shortness Of Breath and Rash  . Guaifenesin Er Nausea And Vomiting    History reviewed. No pertinent past surgical history.  Social History  Substance Use Topics  . Smoking status: Never Smoker   . Smokeless tobacco: Never Used  . Alcohol Use: 3.0 - 4.8 oz/week    0-3 Glasses of wine, 5 Standard drinks or equivalent per week    Family History  Problem Relation Age of Onset  . Hypertension Father   . Hyperlipidemia Father     Medication list has been reviewed  and updated.  Physical Examination:  Physical Exam  Constitutional: She is oriented to person, place, and time.  HENT:  Head: Normocephalic and atraumatic.  Right Ear: Hearing, tympanic membrane, external ear and ear canal normal.  Left Ear: Hearing, tympanic membrane, external ear and ear canal normal.  Nose: Nose normal.  Mouth/Throat: Uvula is midline,  oropharynx is clear and moist and mucous membranes are normal.  Eyes: Conjunctivae, EOM and lids are normal. Pupils are equal, round, and reactive to light. Right eye exhibits no discharge. Left eye exhibits no discharge. No scleral icterus.  Neck: Trachea normal. Carotid bruit is not present. No thyromegaly present.  Cardiovascular: Normal rate, regular rhythm, normal heart sounds, intact distal pulses and normal pulses.   No murmur heard. Pulmonary/Chest: Effort normal and breath sounds normal. She has no wheezes. She has no rhonchi. She has no rales.  Abdominal: Soft. Normal appearance. There is no tenderness.  Musculoskeletal: Normal range of motion.  Lymphadenopathy:       Head (right side): No submental, no submandibular and no tonsillar adenopathy present.       Head (left side): No submental, no submandibular and no tonsillar adenopathy present.    She has no cervical adenopathy.  Neurological: She is alert and oriented to person, place, and time. No cranial nerve deficit. Coordination and gait normal.  Skin: Skin is warm, dry and intact. No lesion and no rash noted.  Psychiatric: She has a normal mood and affect. Her speech is normal and behavior is normal. Thought content normal.   BP 118/74 mmHg  Pulse 90  Temp(Src) 98.2 F (36.8 C) (Oral)  Resp 16  Ht  (1.676 m)  Wt 114 lb (51.71 kg)  BMI 18.41 kg/m2  SpO2 98%  LMP 09/05/2015   Visual Acuity Screening   Right eye Left eye Both eyes  Without correction:     With correction: 20/20 20/15 -2 20/20   Assessment and Plan:  1. Annual physical exam Up to date on preventative screenings. Forms filled out for school. - POCT CBC  2. Screening-pulmonary TB - Quantiferon tb gold assay (blood)  3. Chest pain, unspecified chest pain type CP improved since started over 6 months ago. Full cardiac work up negative including CXR, ETT, chest angiogram.   Roswell Miners. Dyke Brackett, MHS [facility]  09/12/2015

## 2015-09-12 NOTE — Patient Instructions (Signed)
     IF you received an x-ray today, you will receive an invoice from [facility]. Please contact [facility] at [phone] with questions or concerns regarding your invoice.   IF you received labwork today, you will receive an invoice from [facility]. Please contact [facility] at [phone] with questions or concerns regarding your invoice.   Our billing staff will not be able to assist you with questions regarding bills from these companies.  You will be contacted with the lab results as soon as they are available. The fastest way to get your results is to activate your My Chart account. Instructions are located on the last page of this paperwork. If you have not heard from us regarding the results in 2 weeks, please contact this office.      

## 2015-09-14 LAB — QUANTIFERON TB GOLD ASSAY (BLOOD)
INTERFERON GAMMA RELEASE ASSAY: NEGATIVE
Mitogen-Nil: >10 IU/mL
QUANTIFERON TB AG MINUS NIL: 0 [IU]/mL
Quantiferon Nil Value: 0.02 IU/mL

## 2015-10-10 ENCOUNTER — Ambulatory Visit (INDEPENDENT_AMBULATORY_CARE_PROVIDER_SITE_OTHER): Payer: BC Managed Care – PPO | Admitting: Physician Assistant

## 2015-10-10 VITALS — BP 133/82 | HR 79 | Temp 97.7°F | Resp 16 | Ht 65.5 in | Wt 113.8 lb

## 2015-10-10 DIAGNOSIS — K219 Gastro-esophageal reflux disease without esophagitis: Secondary | ICD-10-CM

## 2015-10-10 LAB — COMPLETE METABOLIC PANEL WITH GFR
ALT: 12 U/L (ref 6–29)
AST: 15 U/L (ref 10–30)
Albumin: 4.6 g/dL (ref 3.6–5.1)
Alkaline Phosphatase: 43 U/L (ref 33–115)
BILIRUBIN TOTAL: 0.6 mg/dL (ref 0.2–1.2)
BUN: 15 mg/dL (ref 7–25)
CO2: 26 mmol/L (ref 20–31)
Calcium: 9.7 mg/dL (ref 8.6–10.2)
Chloride: 99 mmol/L (ref 98–110)
Creat: 0.85 mg/dL (ref 0.50–1.10)
Glucose, Bld: 74 mg/dL (ref 65–99)
POTASSIUM: 4.3 mmol/L (ref 3.5–5.3)
SODIUM: 139 mmol/L (ref 135–146)
TOTAL PROTEIN: 7 g/dL (ref 6.1–8.1)

## 2015-10-10 LAB — CBC
HCT: 43.8 % (ref 35.0–45.0)
Hemoglobin: 15 g/dL (ref 11.7–15.5)
MCH: 31.4 pg (ref 27.0–33.0)
MCHC: 34.2 g/dL (ref 32.0–36.0)
MCV: 91.6 fL (ref 80.0–100.0)
MPV: 9.5 fL (ref 7.5–12.5)
PLATELETS: 249 10*3/uL (ref 140–400)
RBC: 4.78 MIL/uL (ref 3.80–5.10)
RDW: 12.2 % (ref 11.0–15.0)
WBC: 10.3 10*3/uL (ref 3.8–10.8)

## 2015-10-10 LAB — POC MICROSCOPIC URINALYSIS (UMFC): MUCUS RE: ABSENT

## 2015-10-10 LAB — POCT URINALYSIS DIP (MANUAL ENTRY)
Bilirubin, UA: NEGATIVE
GLUCOSE UA: NEGATIVE
Ketones, POC UA: NEGATIVE
NITRITE UA: NEGATIVE
PH UA: 7
Protein Ur, POC: NEGATIVE
RBC UA: NEGATIVE
Spec Grav, UA: 1.015
UROBILINOGEN UA: 0.2

## 2015-10-10 MED ORDER — RANITIDINE HCL 150 MG PO TABS
300.0000 mg | ORAL_TABLET | Freq: Once | ORAL | Status: AC
  Administered 2015-10-10: 300 mg via ORAL

## 2015-10-10 MED ORDER — RANITIDINE HCL 150 MG PO TABS: ORAL_TABLET | ORAL | Status: DC

## 2015-10-10 MED ORDER — OMEPRAZOLE 20 MG PO CPDR: DELAYED_RELEASE_CAPSULE | ORAL | Status: DC

## 2015-10-10 NOTE — Patient Instructions (Addendum)
Sleep with the head of the bed elevated.     IF you received an x-ray today, you will receive an invoice from [facility]. Please contact [facility] at [phone] with questions or concerns regarding your invoice.   IF you received labwork today, you will receive an invoice from [facility]. Please contact [facility] at [phone] with questions or concerns regarding your invoice.   Our billing staff will not be able to assist you with questions regarding bills from these companies.  You will be contacted with the lab results as soon as they are available. The fastest way to get your results is to activate your My Chart account. Instructions are located on the last page of this paperwork. If you have not heard from us regarding the results in 2 weeks, please contact this office.     Food Choices for Gastroesophageal Reflux Disease, Adult When you have gastroesophageal reflux disease (GERD), the foods you eat and your eating habits are very important. Choosing the right foods can help ease the discomfort of GERD. WHAT GENERAL GUIDELINES DO I NEED TO FOLLOW?  Choose fruits, vegetables, whole grains, low-fat dairy products, and low-fat meat, fish, and poultry.  Limit fats such as oils, salad dressings, butter, nuts, and avocado.  Keep a food diary to identify foods that cause symptoms.  Avoid foods that cause reflux. These may be different for different people.  Eat frequent small meals instead of three large meals each day.  Eat your meals slowly, in a relaxed setting.  Limit fried foods.  Cook foods using methods other than frying.  Avoid drinking alcohol.  Avoid drinking large amounts of liquids with your meals.  Avoid bending over or lying down until 2-3 hours after eating. WHAT FOODS ARE NOT RECOMMENDED? The following are some foods and drinks that may worsen your symptoms: Vegetables Tomatoes. Tomato juice. Tomato and  spaghetti sauce. Chili peppers. Onion and garlic. Horseradish. Fruits Oranges, grapefruit, and lemon (fruit and juice). Meats High-fat meats, fish, and poultry. This includes hot dogs, ribs, ham, sausage, salami, and bacon. Dairy Whole milk and chocolate milk. Sour cream. Cream. Butter. Ice cream. Cream cheese.  Beverages Coffee and tea, with or without caffeine. Carbonated beverages or energy drinks. Condiments Hot sauce. Barbecue sauce.  Sweets/Desserts Chocolate and cocoa. Donuts. Peppermint and spearmint. Fats and Oils High-fat foods, including Jamaica[place] fries and potato chips. Other Vinegar. Strong spices, such as black pepper, white pepper, red pepper, cayenne, curry powder, cloves, ginger, and chili powder. The items listed above may not be a complete list of foods and beverages to avoid. Contact your dietitian for more information.   This information is not intended to replace advice given to you by your health care provider. Make sure you discuss any questions you have with your health care provider.   Document Released: 05/03/2005 Document Revised: 05/24/2014 Document Reviewed: 03/07/2013 Elsevier Interactive Patient Education [employer]2016 Elsevier Inc.

## 2015-10-10 NOTE — Progress Notes (Signed)
10/10/2015 4:09 PM   DOB: [birth year] / MRN: [id number]  SUBJECTIVE:  Amy Navarro is a 25 y.o. female presenting for epigastric burning.  Reports this has been present for about 1 week now and the pain ranges from a 3-7/10.  She reports that lying flat makes the pain worse.  She complains that coffee makes her symptoms worse.  She has not been able to eat or drink in the last week and has lost about 4 lbs as a result.  She started taking prilosec 2 days ago but reports it is not helping.  She reports her sister has a history of H. Pylori.  She enjoys running for recreation and had a good two mile run 4 days ago and denies any new DOE and chest pain with that.    She is allergic to latex and guaifenesin er.   She  has a past medical history of Asthma; Blood transfusion without reported diagnosis; and Raynaud disease.    She  reports that she has never smoked. She has never used smokeless tobacco. She reports that she drinks about 3.0 - 4.8 oz of alcohol per week. She reports that she does not use illicit drugs. She  reports that she currently engages in sexual activity. She reports using the following methods of birth control/protection: None and Pill. The patient  has no past surgical history on file.  Her family history includes Hyperlipidemia in her father; Hypertension in her father.  Review of Systems  Constitutional: Negative for fever and chills.  Gastrointestinal: Positive for heartburn. Negative for nausea, vomiting, abdominal pain and diarrhea.  Musculoskeletal: Negative for myalgias.  Skin: Negative for rash.  Neurological: Negative for dizziness and headaches.    Problem list and medications reviewed and updated by myself where necessary, and exist elsewhere in the encounter.   OBJECTIVE:  BP 133/82 mmHg  Pulse 79  Temp(Src) 97.7 F (36.5 C) (Oral)  Resp 16  Ht 5' 5.5" (1.664 m)  Wt 113 lb 12.8 oz (51.619 kg)  BMI 18.64 kg/m2  SpO2 100%  LMP 09/26/2015  Physical  Exam  Constitutional: She is oriented to person, place, and time. No distress.  Cardiovascular: Normal rate, regular rhythm and normal heart sounds.   Pulmonary/Chest: Effort normal and breath sounds normal.  Abdominal: She exhibits no distension and no mass. There is no tenderness. There is no rebound and no guarding.  Musculoskeletal: Normal range of motion.  Neurological: She is alert and oriented to person, place, and time.  Skin: Skin is warm and dry. She is not diaphoretic.  Psychiatric: She has a normal mood and affect.    Results for orders placed or performed in visit on 10/10/15 (from the past 72 hour(s))  POCT urinalysis dipstick     Status: Abnormal   Collection Time: 10/10/15  3:30 PM  Result Value Ref Range   Color, UA yellow yellow   Clarity, UA clear clear   Glucose, UA negative negative   Bilirubin, UA negative negative   Ketones, POC UA negative negative   Spec Grav, UA 1.015    Blood, UA negative negative   pH, UA 7.0    Protein Ur, POC negative negative   Urobilinogen, UA 0.2    Nitrite, UA Negative Negative   Leukocytes, UA small (1+) (A) Negative    No results found.  ASSESSMENT AND PLAN  Zamya was seen today for possible stomach ulcer, fatigue, burning sensation in stomach, dizziness and anorexia.  Diagnoses and all orders for  this visit:  Gastroesophageal reflux disease, esophagitis presence not specified -     CBC -     COMPLETE METABOLIC PANEL WITH GFR -     H. pylori breath test -     POCT urinalysis dipstick -     ranitidine (ZANTAC) tablet 300 mg; Take 2 tablets (300 mg total) by mouth once. -     POCT Microscopic Urinalysis ([facility])  Other orders -     omeprazole (PRILOSEC) 20 MG capsule; Take for 6 weeks before breakfast.To wean take one every other day for 7 days, then 1 every 3 days for 7 days. -     ranitidine (ZANTAC) 150 MG tablet; Take at night only as needed.    The patient was advised to call or return to clinic if she does  not see an improvement in symptoms or to seek the care of the closest emergency department if she worsens with the above plan.   Deliah BostonMichael Kyiah Canepa, MHS, PA-C [facility] 10/10/2015 4:09 PM

## 2015-10-13 LAB — H. PYLORI BREATH TEST: H. PYLORI BREATH TEST: NOT DETECTED

## 2015-10-15 ENCOUNTER — Encounter: Payer: Self-pay | Admitting: Internal Medicine

## 2016-06-24 ENCOUNTER — Ambulatory Visit (INDEPENDENT_AMBULATORY_CARE_PROVIDER_SITE_OTHER): Payer: BC Managed Care – PPO | Admitting: Physician Assistant

## 2016-06-24 VITALS — BP 124/62 | HR 110 | Temp 98.4°F | Ht 65.5 in | Wt 110.4 lb

## 2016-06-24 DIAGNOSIS — B9789 Other viral agents as the cause of diseases classified elsewhere: Secondary | ICD-10-CM

## 2016-06-24 DIAGNOSIS — J069 Acute upper respiratory infection, unspecified: Secondary | ICD-10-CM

## 2016-06-24 DIAGNOSIS — R079 Chest pain, unspecified: Secondary | ICD-10-CM | POA: Diagnosis not present

## 2016-06-24 NOTE — Progress Notes (Signed)
Patient ID: Amy PandyCaroline Navarro, female    DOB: 09/07/1990, 26 y.o.   MRN: [id number]  PCP: Lucilla EdinAUB, STEVE A, MD  Chief Complaint  Patient presents with  . Generalized Body Aches    X 2 days  . Headache    X 1 week -pt states off and on  . Fatigue    X 4-5 days    Subjective:   Presents for evaluation of fatigue, headaches, and body aches.  Pt is a 26yo Caucasian female who presents with headaches and fatigue x 4-5 days and body aches x 2 days. Headaches are intermittent, associated with photophobia and phonophobia, and relieved with Ibuprofen. She has been fatigued and napping, though she never naps. She also has had congestion, sore throat, night sweats, flushing, and weakness. She had EBV 4-5 yrs ago, but this is different from her symptoms at that time. Denies rash, fever, cough, SOB, abdominal pain, nausea, vomiting, diarrhea, or constipation. She did have a flu shot this year.  Pt also complains of occasional chest pain and palpitations that are similar to what she got worked up for in Dec 2016. She underwent full evaluation at that time, including EKG, cardiac echo, MRA, and 48 hr Holter Monitor. Her cardiologist believed that the symptoms were stemming from her anxiety. She has recently had increased anxiety and started Lamotrigine 2 weeks ago.  Review of Systems See HPI  Patient Active Problem List   Diagnosis Date Noted  . Chest pain 09/12/2015  . Palpitations 05/02/2015  . Exercise-induced asthma 04/13/2013  . Raynaud's disease 04/13/2013     Prior to Admission medications   Medication Sig Start Date End Date Taking? Authorizing Provider  albuterol (PROVENTIL) (5 MG/ML) 0.5% nebulizer solution Take 2.5 mg by nebulization every 6 (six) hours as needed for wheezing or shortness of breath.   Yes Historical Provider, MD  ALPRAZolam Prudy Feeler(XANAX) 0.5 MG tablet Take 0.5 mg by mouth at bedtime as needed for anxiety.   Yes Historical Provider, MD  amphetamine-dextroamphetamine  (ADDERALL) 20 MG tablet Take 20 mg by mouth daily.   Yes Historical Provider, MD  Fluticasone-Salmeterol (ADVAIR) 100-50 MCG/DOSE AEPB Inhale 1 puff into the lungs 2 (two) times daily as needed.    Yes Historical Provider, MD  norethindrone-ethinyl estradiol (NECON,BREVICON,MODICON) 0.5-35 MG-MCG tablet Take 1 tablet by mouth daily.   Yes Historical Provider, MD  omeprazole (PRILOSEC) 20 MG capsule Take for 6 weeks before breakfast.To wean take one every other day for 7 days, then 1 every 3 days for 7 days. 10/10/15  Yes Ofilia NeasMichael L Clark, PA-C  ranitidine (ZANTAC) 150 MG tablet Take at night only as needed. 10/10/15  Yes Ofilia NeasMichael L Clark, PA-C     Allergies  Allergen Reactions  . Latex Hives, Shortness Of Breath and Rash  . Guaifenesin Er Nausea And Vomiting       Objective:  Physical Exam HEENT: PERRLA. Throat is mildly erythematous, no exudates. Ear canals are clear bilaterally, TMs intact, non-bulging. Left anterior cervical lymphadenopathy, mild tenderness to palpation of right neck. Pulm: Good respiratory effort. CTAB. No wheezes, rales, or rhonchi. CV: RRR. 3/6 systolic murmur along left sternal border. No R/G. Abd: Soft, non-tender, non-distend. + BS x 4 quadrants.      Assessment & Plan:   1. Viral URI Pt advised to drink plenty of water, rest, and take care of herself. Pt may take OTC ibuprofen or acetaminophen for headaches or muscle aches. Pt advised to return if symptoms worsen or if  fever develops.  2. Chest Pain Had a complete cardiac work up in Dec 2016. Pt advised to continue Lamotrigine; if her symptoms of chest pain persist or worsen she should call or return to the office.   Georgiana Spinner, PA-S

## 2016-06-24 NOTE — Progress Notes (Signed)
Patient ID: Amy Navarro, female    DOB: 06/22/1990, [age] y.o.   MRN: [id number]  PCP: Lucilla EdinAUB, STEVE A, MD  Chief Complaint  Patient presents with  . Generalized Body Aches    X 2 days  . Headache    X 1 week -pt states off and on  . Fatigue    X 4-5 days    Subjective:   Presents for evaluation of illness x 4-7 days.  Headache and fatigue began first and are intermittent. Relieved with OTC NSAIDS. Some photo-/phonophobia associated with her headaches. Congestion, sore throat, night sweats, flushing and generalized weakness/malaise.  2 days ago she developed body aches.  No fever. No cough. No GI/GU symptoms. Had Mono a couple of years ago, but this feels different.  She received a flu vaccine this season.  Chest pain and palpitations occur occasionally. Had these previously, and underwent comprehensive cardiology evaluation in 04/2015, which resulted in a diagnosis of anxiety. Recent increase in anxiety resulted in a prescription for lamotrigine 2 weeks ago. So far, she is tolerating it well.    Review of Systems As above.    Patient Active Problem List   Diagnosis Date Noted  . Chest pain 09/12/2015  . Palpitations 05/02/2015  . Exercise-induced asthma 04/13/2013  . Raynaud's disease 04/13/2013     Prior to Admission medications   Medication Sig Start Date End Date Taking? Authorizing Provider  albuterol (PROVENTIL) (5 MG/ML) 0.5% nebulizer solution Take 2.5 mg by nebulization every 6 (six) hours as needed for wheezing or shortness of breath.   Yes Historical Provider, MD  ALPRAZolam Prudy Feeler(XANAX) 0.5 MG tablet Take 0.5 mg by mouth at bedtime as needed for anxiety.   Yes Historical Provider, MD  amphetamine-dextroamphetamine (ADDERALL) 20 MG tablet Take 20 mg by mouth daily.   Yes Historical Provider, MD  Fluticasone-Salmeterol (ADVAIR) 100-50 MCG/DOSE AEPB Inhale 1 puff into the lungs 2 (two) times daily as needed.    Yes Historical Provider, MD    norethindrone-ethinyl estradiol (NECON,BREVICON,MODICON) 0.5-35 MG-MCG tablet Take 1 tablet by mouth daily.   Yes Historical Provider, MD  omeprazole (PRILOSEC) 20 MG capsule Take for 6 weeks before breakfast.To wean take one every other day for 7 days, then 1 every 3 days for 7 days. 10/10/15  Yes Ofilia NeasMichael L Clark, PA-C  ranitidine (ZANTAC) 150 MG tablet Take at night only as needed. 10/10/15  Yes Ofilia NeasMichael L Clark, PA-C  lamoTRIgine (LAMICTAL) 25 MG tablet TK 1 T PO HS FOR 14 DAYS THEN 2 TS HS 06/15/16   Historical Provider, MD     Allergies  Allergen Reactions  . Latex Hives, Shortness Of Breath and Rash  . Guaifenesin Er Nausea And Vomiting       Objective:  Physical Exam  Constitutional: She is oriented to person, place, and time. She appears well-developed and well-nourished. She is active and cooperative. No distress.  BP 124/62   Pulse (!) 110   Temp 98.4 F (36.9 C) (Oral)   Ht 5' 5.5" (1.664 m)   Wt 110 lb 6.4 oz (50.1 kg)   LMP 06/08/2016 (Approximate)   SpO2 98%   BMI 18.09 kg/m   HENT:  Head: Normocephalic and atraumatic.  Right Ear: Hearing, tympanic membrane, external ear and ear canal normal.  Left Ear: Hearing, tympanic membrane, external ear and ear canal normal.  Nose: Nose normal. Right sinus exhibits no maxillary sinus tenderness and no frontal sinus tenderness. Left sinus exhibits no maxillary sinus tenderness and  no frontal sinus tenderness.  Mouth/Throat: Uvula is midline, oropharynx is clear and moist and mucous membranes are normal. No posterior oropharyngeal erythema.  Eyes: Conjunctivae are normal. No scleral icterus.  Neck: Normal range of motion. Neck supple. No thyromegaly present.  Cardiovascular: Normal rate, regular rhythm and normal heart sounds.   Pulses:      Radial pulses are 2+ on the right side, and 2+ on the left side.  Pulmonary/Chest: Effort normal and breath sounds normal.  Lymphadenopathy:       Head (right side): No tonsillar, no  preauricular, no posterior auricular and no occipital adenopathy present.       Head (left side): No tonsillar, no preauricular, no posterior auricular and no occipital adenopathy present.    She has no cervical adenopathy.       Right: No supraclavicular adenopathy present.       Left: No supraclavicular adenopathy present.  Neurological: She is alert and oriented to person, place, and time. No sensory deficit.  Skin: Skin is warm, dry and intact. No rash noted. No cyanosis or erythema. Nails show no clubbing.  Psychiatric: She has a normal mood and affect. Her speech is normal and behavior is normal.           Assessment & Plan:   1. Viral URI Supportive care.  Anticipatory guidance.  RTC if symptoms worsen/persist.   2. Chest pain, unspecified type Occasional, and recent comprehensive cardiac evaluation. Likely anxiety. Continue with treatment, but if CP or palpitations become more frequent or severe, would recommend re-evaluation.   Fernande Bras, PA-C Physician Assistant-Certified [facility] at [facility]

## 2016-06-24 NOTE — Patient Instructions (Addendum)
Drink plenty of water, get plenty of rest, and take care of yourself. Wash your hands and avoid the spread of infection. Take over-the-counter Ibuprofen or Acetaminophen for relief of your headaches and muscle aches.  Call or return to office if your symptoms get worse or fever (>100.4 F) develops.    IF you received an x-ray today, you will receive an invoice from [facility]. Please contact [facility] at [phone] with questions or concerns regarding your invoice.   IF you received labwork today, you will receive an invoice from [facility]. Please contact [facility] at [phone] with questions or concerns regarding your invoice.   Our billing staff will not be able to assist you with questions regarding bills from these companies.  You will be contacted with the lab results as soon as they are available. The fastest way to get your results is to activate your My Chart account. Instructions are located on the last page of this paperwork. If you have not heard from us regarding the results in 2 weeks, please contact this office.

## 2016-07-05 ENCOUNTER — Encounter: Payer: Self-pay | Admitting: Physician Assistant

## 2016-07-05 ENCOUNTER — Ambulatory Visit (INDEPENDENT_AMBULATORY_CARE_PROVIDER_SITE_OTHER): Payer: BC Managed Care – PPO | Admitting: Physician Assistant

## 2016-07-05 ENCOUNTER — Ambulatory Visit: Payer: BC Managed Care – PPO | Admitting: Family Medicine

## 2016-07-05 VITALS — BP 126/84 | HR 81 | Temp 97.8°F | Resp 18 | Ht 65.5 in | Wt 110.6 lb

## 2016-07-05 DIAGNOSIS — M62838 Other muscle spasm: Secondary | ICD-10-CM

## 2016-07-05 DIAGNOSIS — M5010 Cervical disc disorder with radiculopathy, unspecified cervical region: Secondary | ICD-10-CM

## 2016-07-05 MED ORDER — METHYLPREDNISOLONE ACETATE 80 MG/ML IJ SUSP
80.0000 mg | Freq: Once | INTRAMUSCULAR | Status: AC
  Administered 2016-07-05: 80 mg via INTRAMUSCULAR

## 2016-07-05 MED ORDER — CYCLOBENZAPRINE HCL 10 MG PO TABS: 5.0000 mg | ORAL_TABLET | Freq: Three times a day (TID) | ORAL | 0 refills | Status: DC | PRN

## 2016-07-05 NOTE — Patient Instructions (Addendum)
  Take 1000 mg of tylenol every eight hours as needed for neck pain and HA.     IF you received an x-ray today, you will receive an invoice from [facility]. Please contact [facility] at [phone] with questions or concerns regarding your invoice.   IF you received labwork today, you will receive an invoice from [facility]. Please contact [facility] at [phone] with questions or concerns regarding your invoice.   Our billing staff will not be able to assist you with questions regarding bills from these companies.  You will be contacted with the lab results as soon as they are available. The fastest way to get your results is to activate your My Chart account. Instructions are located on the last page of this paperwork. If you have not heard from us regarding the results in 2 weeks, please contact this office.

## 2016-07-05 NOTE — Progress Notes (Signed)
07/06/2016 12:52 PM   DOB: May 06, 1991 / MRN: [id number]  SUBJECTIVE:  Amy Navarro is a 26 y.o. female presenting for a right sided mild non pulsatile posterior HA along with some radicular symptoms about the right upper extremity, particularly the right first three digits.  This has been going on now for about 4-5 days.  She has tried 400 mg of iburpofen bid without much relief.  She does have a history of GERD and questionable ulcer in the past and is fearful of NSAIDS.       She is allergic to latex and guaifenesin er.   She  has a past medical history of Asthma; Blood transfusion without reported diagnosis; and Raynaud disease.    She  reports that she has never smoked. She has never used smokeless tobacco. She reports that she drinks about 3.0 - 4.8 oz of alcohol per week . She reports that she does not use drugs. She  reports that she currently engages in sexual activity. She reports using the following methods of birth control/protection: None and Pill. The patient  has no past surgical history on file.  Her family history includes Hyperlipidemia in her father; Hypertension in her father.  Review of Systems  Constitutional: Negative for chills and fever.  Skin: Negative for rash.  Neurological: Negative for dizziness.    The problem list and medications were reviewed and updated by myself where necessary and exist elsewhere in the encounter.   OBJECTIVE:  BP (!) 135/95   Pulse 81   Temp 97.8 F (36.6 C) (Oral)   Resp 18   Ht 5' 5.5" (1.664 m)   Wt 110 lb 9.6 oz (50.2 kg)   LMP 06/08/2016 (Approximate)   SpO2 100%   BMI 18.12 kg/m   Physical Exam  Constitutional: She is oriented to person, place, and time. She appears well-nourished. No distress.  HENT:  Mouth/Throat: No oropharyngeal exudate.  Eyes: EOM are normal. Pupils are equal, round, and reactive to light.  Cardiovascular: Normal rate and regular rhythm.   Pulmonary/Chest: Effort normal and breath sounds  normal.  Abdominal: She exhibits no distension.  Musculoskeletal: Normal range of motion. She exhibits no edema, tenderness or deformity.  Neurological: She is alert and oriented to person, place, and time. She has normal reflexes. She displays no atrophy, no tremor and normal reflexes. No cranial nerve deficit or sensory deficit. She exhibits normal muscle tone. She displays no seizure activity. Coordination and gait normal. GCS eye subscore is 4. GCS verbal subscore is 5. GCS motor subscore is 6.  Reflex Scores:      Tricep reflexes are 2+ on the right side and 2+ on the left side.      Bicep reflexes are 2+ on the right side and 2+ on the left side.      Brachioradialis reflexes are 2+ on the right side and 2+ on the left side.      Patellar reflexes are 2+ on the right side and 2+ on the left side.      Achilles reflexes are 2+ on the right side and 2+ on the left side. Negative spurlings, negative Tinnels, negative phalens. Negative atrophy about the thenar eminence.   Skin: Skin is warm and dry. She is not diaphoretic.  Psychiatric: Her mood appears anxious.  Vitals reviewed.   No results found for this or any previous visit (from the past 72 hour(s)).  No results found.  ASSESSMENT AND PLAN:  Londin was seen today for headache  and hand pain.  Diagnoses and all orders for this visit:  Cervical disc disorder with radiculopathy of cervical region: She has had difficulty with NSAIDs in the past and given the radicular nature of these symptoms I am not sure they would help her all that much. She does not want a dose pack.  Will try depo and advised tylenol and flexeril. She will come back for imaging if this plan does not remit her symptoms.  -     methylPREDNISolone acetate (DEPO-MEDROL) injection 80 mg; Inject 1 mL (80 mg total) into the muscle once.  Spasm of cervical paraspinous muscle -     cyclobenzaprine (FLEXERIL) 10 MG tablet; Take 0.5-1 tablets (5-10 mg total) by mouth 3  (three) times daily as needed for muscle spasms (May cause drowsiness. Do no operate heavy machinery while taking.).    The patient is advised to call or return to clinic if she does not see an improvement in symptoms, or to seek the care of the closest emergency department if she worsens with the above plan.   Deliah BostonMichael Catilyn Boggus, MHS, PA-C [facility] 07/06/2016 12:52 PM

## 2016-07-06 ENCOUNTER — Ambulatory Visit: Payer: BC Managed Care – PPO

## 2016-07-22 ENCOUNTER — Encounter: Payer: Self-pay | Admitting: Physician Assistant

## 2016-07-31 ENCOUNTER — Ambulatory Visit (INDEPENDENT_AMBULATORY_CARE_PROVIDER_SITE_OTHER): Payer: BC Managed Care – PPO

## 2016-07-31 ENCOUNTER — Ambulatory Visit (INDEPENDENT_AMBULATORY_CARE_PROVIDER_SITE_OTHER): Payer: BC Managed Care – PPO | Admitting: Family Medicine

## 2016-07-31 VITALS — BP 118/60 | HR 78 | Temp 97.9°F | Ht 65.5 in | Wt 118.0 lb

## 2016-07-31 DIAGNOSIS — Z0000 Encounter for general adult medical examination without abnormal findings: Secondary | ICD-10-CM | POA: Diagnosis not present

## 2016-07-31 DIAGNOSIS — E559 Vitamin D deficiency, unspecified: Secondary | ICD-10-CM | POA: Diagnosis not present

## 2016-07-31 DIAGNOSIS — R42 Dizziness and giddiness: Secondary | ICD-10-CM | POA: Diagnosis not present

## 2016-07-31 DIAGNOSIS — I7300 Raynaud's syndrome without gangrene: Secondary | ICD-10-CM | POA: Diagnosis not present

## 2016-07-31 DIAGNOSIS — G5691 Unspecified mononeuropathy of right upper limb: Secondary | ICD-10-CM

## 2016-07-31 DIAGNOSIS — Z111 Encounter for screening for respiratory tuberculosis: Secondary | ICD-10-CM

## 2016-07-31 NOTE — Progress Notes (Signed)
  Tuberculosis Risk Questionnaire  1. No Were you born outside the [place] in one of the following parts of the world: [place], [place], [place], [place] or [place]?    2. No Have you traveled outside the [place] and lived for more than one month in one of the following parts of the world: [place], [place], [place], [place] or [place]?    3. No Do you have a compromised immune system such as from any of the following conditions:HIV/AIDS, organ or bone marrow transplantation, diabetes, immunosuppressive medicines (e.g. Prednisone, Remicaide), leukemia, lymphoma, cancer of the head or neck, gastrectomy or jejunal bypass, end-stage renal disease (on dialysis), or silicosis?     4.Yes Have you ever or do you plan on working in: a residential care center, a health care facility, a jail or prison or homeless shelter?    5. No Have you ever: injected illegal drugs, used crack cocaine, lived in a homeless shelter  or been in jail or prison?     6. Yes  Have you ever been exposed to anyone with infectious tuberculosis? Had PPE  Worked in acute care.     Tuberculosis Symptom Questionnaire  Do you currently have any of the following symptoms?  1. No Unexplained cough lasting more than 3 weeks?   2. No Unexplained fever lasting more than 3 weeks.   3. Yes  Night Sweats (sweating that leaves the bedclothes and sheets wet)     4. Yes anxiety Shortness of Breath   5. Yes anxiety Chest Pain   6. Yes  Unintentional weight loss    7. No Unexplained fatigue (very tired for no reason)

## 2016-07-31 NOTE — Progress Notes (Addendum)
Subjective:  By signing my name below, I, Amy Navarro, attest that this documentation has been prepared under the direction and in the presence of Amy Sorenson, MD Electronically Signed: Charline Navarro, ED Scribe 07/31/2016 at 10:57 AM.   Patient ID: Amy Navarro, female    DOB: 1990-08-24, 26 y.o.   MRN: [id number]  Chief Complaint  Patient presents with  . Annual Exam    also needs TB skin test    HPI Amy Navarro is a 26 y.o. female who presents to Primary Care at [facility] for an annual exam and TB test. 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 Pt is fasting at this visit. Pt takes Advair during allergy season and exercising only. She also takes a Biotin supplement.   She reports a h/o night sweats, sob, chest pain and weight loss for a few years. Pt has been evaluated by several physicians in the past. States she had an abnormal EKG, heart murmur, costochondritis. She describes intermittent chest pain as a shooting, electric shock. Pt also reports intermittent swelling over her right wrist and intermittent pain that extends from the base of her head and down her left upper extremity. Pain is exacerbated with certain activities. She was evaluated by Deliah Boston, MD for the same and given DepoMedrol injection and flexeril at night with relief. She has also tried [employer] with some relief. Pt requests a referral to neurology at this visit. She denies a possibility of pregnancy. Cardiologist Tobias Alexander, MD.  Raynaud's Disease She does report a h/o Raynaud's Disease which was diagnosed at [facility] in 2016. Pt saw a PA with Dr. Lendon Colonel. She has not tried any treatments for Raynaud's. She suspects that Raynaud's is gradually worsening and has noticed intermittent light-headedness.  Immunizations Pt's last tetanus was in 2010.   Preventative Maintenance  Her pap this year showed abnormal cells 2 years ago that resolved and returned again this year. Pt reports light  menstrual periods that she attributes to being on the same [facility] for several years. She had STI screenings done by her GYN. Ophthalmologist: Dr. Dione Booze.  Anxiety  Pt is currently taking Lamictal and Xanax for anxiety that she started ~2 months ago. She is managed by psychiatrist Andee Poles, MD. Pt has also taken Adderall since 2010. She has failed Vyvanse, states it made her agitated. Pt is not currently in therapy.   Past Medical History:  Diagnosis Date  . Asthma   . Blood transfusion without reported diagnosis   . Raynaud disease    Current Outpatient Prescriptions on File Prior to Visit  Medication Sig Dispense Refill  . ALPRAZolam (XANAX) 0.5 MG tablet Take 0.5 mg by mouth at bedtime as needed for anxiety.    Marland Kitchen amphetamine-dextroamphetamine (ADDERALL) 20 MG tablet Take 20 mg by mouth daily.    . cyclobenzaprine (FLEXERIL) 10 MG tablet Take 0.5-1 tablets (5-10 mg total) by mouth 3 (three) times daily as needed for muscle spasms (May cause drowsiness. Do no operate heavy machinery while taking.). 30 tablet 0  . Fluticasone-Salmeterol (ADVAIR) 100-50 MCG/DOSE AEPB Inhale 1 puff into the lungs 2 (two) times daily as needed.     . lamoTRIgine (LAMICTAL) 25 MG tablet TK 1 T PO HS FOR 14 DAYS THEN 2 TS HS  1  . norethindrone-ethinyl estradiol (NECON,BREVICON,MODICON) 0.5-35 MG-MCG tablet Take 1 tablet by mouth daily.    Marland Kitchen albuterol (PROVENTIL) (5 MG/ML) 0.5% nebulizer solution Take 2.5 mg by nebulization every 6 (six) hours  as needed for wheezing or shortness of breath.     No current facility-administered medications on file prior to visit.    Allergies  Allergen Reactions  . Latex Hives, Shortness Of Breath and Rash  . Guaifenesin Er Nausea And Vomiting   No past surgical history on file. Family History  Problem Relation Age of Onset  . Hypertension Father   . Hyperlipidemia Father    Social History   Social History  . Marital status: Single    Spouse name: N/A  . Number of  children: N/A  . Years of education: N/A   Social History Main Topics  . Smoking status: Never Smoker  . Smokeless tobacco: Never Used  . Alcohol use 3.0 - 4.8 oz/week    5 Standard drinks or equivalent per week  . Drug use: No  . Sexual activity: Yes    Birth control/ protection: None, Pill   Other Topics Concern  . None   Social History Narrative  . None   Depression screen [facility] LLCHQ 2/9 07/31/2016 07/05/2016 10/10/2015 09/12/2015 06/20/2015  Decreased Interest 0 0 0 0 0  Down, Depressed, Hopeless 0 0 0 0 0  PHQ - 2 Score 0 0 0 0 0    Review of Systems  Musculoskeletal: Positive for myalgias.  Neurological: Positive for light-headedness.  All other systems reviewed and are negative.     Objective:   Physical Exam  Constitutional: She is oriented to person, place, and time. She appears well-developed and well-nourished. No distress.  HENT:  Head: Normocephalic and atraumatic.  Right Ear: Tympanic membrane normal.  Left Ear: Tympanic membrane normal.  Nose: Nose normal.  Mouth/Throat: Oropharynx is clear and moist.  Eyes: Conjunctivae and EOM are normal.  Neck: Neck supple. No tracheal deviation present. No thyromegaly present.  Cardiovascular: Normal rate, regular rhythm, S1 normal, S2 normal and normal heart sounds.   No murmur heard. Pulses:      Dorsalis pedis pulses are 2+ on the right side, and 2+ on the left side.  Pulmonary/Chest: Effort normal and breath sounds normal. No respiratory distress.  Lungs are clear to auscultation. Good air movement.  Abdominal: Soft. She exhibits no distension. There is no hepatosplenomegaly. There is no tenderness.  Musculoskeletal: Normal range of motion.  Lymphadenopathy:    She has no cervical adenopathy.  Neurological: She is alert and oriented to person, place, and time.  Unable to illicit DTRs  Skin: Skin is warm and dry.  Psychiatric: She has a normal mood and affect. Her behavior is normal.  Nursing note and vitals  reviewed.  BP 118/60   Pulse 78   Temp 97.9 F (36.6 C)   Ht 5' 5.5" (1.664 m)   Wt 118 lb (53.5 kg)   LMP 06/02/2016 (Approximate)   BMI 19.34 kg/m     Orthostatic vitals signs are negative/normal.   Visual Acuity Screening   Right eye Left eye Both eyes  Without correction:     With correction: 20/20 20/20 20/15     Dg Cervical Spine Complete  Result Date: 07/31/2016 CLINICAL DATA:  Intermittent neck pain extending from base of skull to left upper extremity worse with certain activities. No injury. EXAM: CERVICAL SPINE - COMPLETE 4+ VIEW COMPARISON:  None. FINDINGS: There is no evidence of cervical spine fracture or prevertebral soft tissue swelling. Alignment is normal. No other significant bone abnormalities are identified. IMPRESSION: Negative cervical spine radiographs. Electronically Signed   By: Elberta Fortisaniel  Boyle M.D.   On: 07/31/2016 11:51  Assessment & Plan:   1. Annual physical exam   2. Upper extremity neuropathy, right - cpsine xray nml, exam nml - refer to neurology, may need NCV/EMG  3. Orthostatic lightheadedness   4. Raynaud's disease without gangrene - mother also has a variety of autoimmune sxs w/o diagnosis. Pt states she has seen rheum prior w/o findings.  Doesn't sound like sxs are severe enough to need to start nifedipine which she likely wouldn't be able to tolerate to the orthostatic lightheadedness sxs.    Orders Placed This Encounter  Procedures  . DG Cervical Spine Complete    Standing Status:   Future    Number of Occurrences:   1    Standing Expiration Date:   07/31/2017    Order Specific Question:   Reason for Exam (SYMPTOM  OR DIAGNOSIS REQUIRED)    Answer:   worsening right upper extremity pain and weakness    Order Specific Question:   Is the patient pregnant?    Answer:   No    Order Specific Question:   Preferred imaging location?    Answer:   External  . Comprehensive metabolic panel    Order Specific Question:   Has the patient fasted?     Answer:   Yes  . Thyroid Panel With TSH  . CBC with Differential/Platelet  . Lipid panel    Order Specific Question:   Has the patient fasted?    Answer:   Yes  . Vitamin B12  . VITAMIN D 25 Hydroxy (Vit-D Deficiency, Fractures)  . Sedimentation Rate  . C-reactive protein  . ANA w/Reflex if Positive  . RPR  . Hemoglobin A1c  . HIV antibody  . Quantiferon tb gold assay  . Orthostatic vital signs  . Care order/instruction:    Scheduling Instructions:     Complete orders, AVS and go.      Amy Navarro, M.D.  Primary Care at [facility] [address] [place], [place] [address] [phone] phone [phone] fax  08/03/16 7:15 PM

## 2016-07-31 NOTE — Patient Instructions (Signed)
     IF you received an x-ray today, you will receive an invoice from [facility]. Please contact [facility] at [phone] with questions or concerns regarding your invoice.   IF you received labwork today, you will receive an invoice from [facility]. Please contact [facility] at [phone] with questions or concerns regarding your invoice.   Our billing staff will not be able to assist you with questions regarding bills from these companies.  You will be contacted with the lab results as soon as they are available. The fastest way to get your results is to activate your My Chart account. Instructions are located on the last page of this paperwork. If you have not heard from us regarding the results in 2 weeks, please contact this office.     

## 2016-08-02 LAB — CBC WITH DIFFERENTIAL/PLATELET
BASOS: 0 %
Basophils Absolute: 0 10*3/uL (ref 0.0–0.2)
EOS (ABSOLUTE): 0.1 10*3/uL (ref 0.0–0.4)
EOS: 1 %
HEMATOCRIT: 41.2 % (ref 34.0–46.6)
HEMOGLOBIN: 14 g/dL (ref 11.1–15.9)
IMMATURE GRANS (ABS): 0 10*3/uL (ref 0.0–0.1)
IMMATURE GRANULOCYTES: 0 %
LYMPHS: 30 %
Lymphocytes Absolute: 1.6 10*3/uL (ref 0.7–3.1)
MCH: 31.7 pg (ref 26.6–33.0)
MCHC: 34 g/dL (ref 31.5–35.7)
MCV: 93 fL (ref 79–97)
MONOCYTES: 9 %
Monocytes Absolute: 0.5 10*3/uL (ref 0.1–0.9)
NEUTROS ABS: 3.1 10*3/uL (ref 1.4–7.0)
Neutrophils: 60 %
Platelets: 234 10*3/uL (ref 150–379)
RBC: 4.41 x10E6/uL (ref 3.77–5.28)
RDW: 12.7 % (ref 12.3–15.4)
WBC: 5.3 10*3/uL (ref 3.4–10.8)

## 2016-08-02 LAB — COMPREHENSIVE METABOLIC PANEL
ALBUMIN: 4.5 g/dL (ref 3.5–5.5)
ALK PHOS: 48 IU/L (ref 39–117)
ALT: 12 IU/L (ref 0–32)
AST: 15 IU/L (ref 0–40)
Albumin/Globulin Ratio: 1.8 (ref 1.2–2.2)
BUN / CREAT RATIO: 12 (ref 9–23)
BUN: 11 mg/dL (ref 6–20)
Bilirubin Total: 0.5 mg/dL (ref 0.0–1.2)
CALCIUM: 10 mg/dL (ref 8.7–10.2)
CHLORIDE: 100 mmol/L (ref 96–106)
CO2: 22 mmol/L (ref 18–29)
CREATININE: 0.9 mg/dL (ref 0.57–1.00)
GFR, EST AFRICAN AMERICAN: 103 mL/min/{1.73_m2} (ref >59)
GFR, EST NON AFRICAN AMERICAN: 89 mL/min/{1.73_m2} (ref >59)
GLUCOSE: 79 mg/dL (ref 65–99)
Globulin, Total: 2.5 g/dL (ref 1.5–4.5)
Potassium: 4.2 mmol/L (ref 3.5–5.2)
Sodium: 140 mmol/L (ref 134–144)
TOTAL PROTEIN: 7 g/dL (ref 6.0–8.5)

## 2016-08-02 LAB — LIPID PANEL
CHOL/HDL RATIO: 2.1 ratio (ref 0.0–4.4)
Cholesterol, Total: 217 mg/dL — ABNORMAL HIGH (ref 100–199)
HDL: 102 mg/dL (ref >39)
LDL CALC: 101 mg/dL — AB (ref 0–99)
Triglycerides: 72 mg/dL (ref 0–149)
VLDL CHOLESTEROL CAL: 14 mg/dL (ref 5–40)

## 2016-08-02 LAB — VITAMIN B12: VITAMIN B 12: 439 pg/mL (ref 232–1245)

## 2016-08-02 LAB — VITAMIN D 25 HYDROXY (VIT D DEFICIENCY, FRACTURES): Vit D, 25-Hydroxy: 20.5 ng/mL — ABNORMAL LOW (ref 30.0–100.0)

## 2016-08-02 LAB — HEMOGLOBIN A1C
ESTIMATED AVERAGE GLUCOSE: 97 mg/dL
Hgb A1c MFr Bld: 5 % (ref 4.8–5.6)

## 2016-08-02 LAB — ANA W/REFLEX IF POSITIVE: Anti Nuclear Antibody(ANA): NEGATIVE

## 2016-08-02 LAB — THYROID PANEL WITH TSH
FREE THYROXINE INDEX: 2.4 (ref 1.2–4.9)
T3 Uptake Ratio: 26 % (ref 24–39)
T4 TOTAL: 9.4 ug/dL (ref 4.5–12.0)
TSH: 2.21 u[IU]/mL (ref 0.450–4.500)

## 2016-08-02 LAB — RPR: RPR Ser Ql: NONREACTIVE

## 2016-08-02 LAB — SEDIMENTATION RATE: Sed Rate: 2 mm/hr (ref 0–32)

## 2016-08-02 LAB — HIV ANTIBODY (ROUTINE TESTING W REFLEX): HIV SCREEN 4TH GENERATION: NONREACTIVE

## 2016-08-02 LAB — C-REACTIVE PROTEIN: CRP: 1.3 mg/L (ref 0.0–4.9)

## 2016-08-03 MED ORDER — VITAMIN D (ERGOCALCIFEROL) 1.25 MG (50000 UNIT) PO CAPS: 50000.0000 [IU] | ORAL_CAPSULE | ORAL | 0 refills | Status: DC

## 2016-08-04 ENCOUNTER — Encounter: Payer: Self-pay | Admitting: Family Medicine

## 2016-08-04 LAB — QUANTIFERON TB GOLD ASSAY (BLOOD)

## 2016-08-04 LAB — QUANTIFERON IN TUBE
QFT TB AG MINUS NIL VALUE: 0 IU/mL
QUANTIFERON NIL VALUE: 0.03 [IU]/mL
QUANTIFERON TB AG VALUE: 0.03 IU/mL
QUANTIFERON TB GOLD: NEGATIVE

## 2016-08-05 ENCOUNTER — Encounter: Payer: Self-pay | Admitting: Neurology

## 2016-08-10 ENCOUNTER — Telehealth: Payer: Self-pay | Admitting: Family Medicine

## 2016-08-10 NOTE — Telephone Encounter (Signed)
Patient dropped off forms for Dr Clelia CroftShaw to complete. I have placed the forms in her box on 08/10/16 please call patient when forms are ready for pick up   [phone]

## 2016-08-12 NOTE — Telephone Encounter (Signed)
Completed and returned the forms to the nurse box with a note - it needs a record of her immunizations which we do not have but hopefully is in [facility] and can copy over, if not pt will need to see if she can find and bring - ok to transfer the immunizations to the form - I do not need to see if again as long as she has had all the ones marked "required".

## 2016-08-13 NOTE — Telephone Encounter (Signed)
Completed and up front for pick up, pt advised

## 2016-09-01 ENCOUNTER — Ambulatory Visit (INDEPENDENT_AMBULATORY_CARE_PROVIDER_SITE_OTHER): Payer: BC Managed Care – PPO | Admitting: Family Medicine

## 2016-09-01 VITALS — BP 135/84 | HR 93 | Temp 98.2°F | Resp 16 | Ht 65.5 in | Wt 116.8 lb

## 2016-09-01 DIAGNOSIS — L210 Seborrhea capitis: Secondary | ICD-10-CM | POA: Diagnosis not present

## 2016-09-01 NOTE — Progress Notes (Signed)
   Amy Navarro is a 26 y.o. female who presents to Primary Care at [facility] today for pruritic scalp.  1.  Patient notes that her scalp has been itching for 4 weeks. She feels it may be getting progressively worse. She hasn't noted any rash/lesion but does note intermittent flaking. No fevers, chills, drainage, hair loss. No one else in the home has similar symptoms. She is in school for OT.  No new lotions/shampoos/conditioners.    ROS as above.  Pertinently, no chest pain, palpitations, SOB, Fever, Chills, Abd pain, N/V/D.   PMH reviewed. Patient is a nonsmoker.   Past Medical History:  Diagnosis Date  . Asthma   . Blood transfusion without reported diagnosis   . Raynaud disease    No past surgical history on file.  Medications reviewed. Current Outpatient Prescriptions  Medication Sig Dispense Refill  . albuterol (PROVENTIL) (5 MG/ML) 0.5% nebulizer solution Take 2.5 mg by nebulization every 6 (six) hours as needed for wheezing or shortness of breath.    . ALPRAZolam (XANAX) 0.5 MG tablet Take 0.5 mg by mouth at bedtime as needed for anxiety.    Marland Kitchen amphetamine-dextroamphetamine (ADDERALL) 20 MG tablet Take 20 mg by mouth daily.    . Fluticasone-Salmeterol (ADVAIR) 100-50 MCG/DOSE AEPB Inhale 1 Navarro into the lungs 2 (two) times daily as needed.     . ISOtretinoin (ACCUTANE) 30 MG capsule Take 30 mg by mouth daily.    Marland Kitchen lamoTRIgine (LAMICTAL) 25 MG tablet TK 1 T PO HS FOR 14 DAYS THEN 2 TS HS  1  . norethindrone-ethinyl estradiol (NECON,BREVICON,MODICON) 0.5-35 MG-MCG tablet Take 1 tablet by mouth daily.    . Vitamin D, Ergocalciferol, (DRISDOL) 50000 units CAPS capsule Take 1 capsule (50,000 Units total) by mouth every 7 (seven) days. 30 capsule 0  . cyclobenzaprine (FLEXERIL) 10 MG tablet Take 0.5-1 tablets (5-10 mg total) by mouth 3 (three) times daily as needed for muscle spasms (May cause drowsiness. Do no operate heavy machinery while taking.). (Patient not taking: Reported on  09/01/2016) 30 tablet 0   No current facility-administered medications for this visit.      Physical Exam:  BP 135/84   Pulse 93   Temp 98.2 F (36.8 C) (Oral)   Resp 16   Ht 5' 5.5" (1.664 m)   Wt 116 lb 12.8 oz (53 kg)   SpO2 99%   BMI 19.14 kg/m  Gen:  Alert, cooperative patient who appears stated age in no acute distress.  Vital signs reviewed. Head: no lesions noted. Small white flakes, dry scalp. No erythema.  Pulm:  Clear to auscultation bilaterally with good air movement.  No wheezes or rales noted.   Cardiac:  Regular rate and rhythm without murmur auscultated.  Good S1/S2.  Assessment and Plan:  1.  26 y/o F presenting for pruritic scalp with exam consistent with dandruff. Flaking white, not yellow making seborrheic dermatitis less likely (plus, same treatment is warranted). No evidence of tinea capitus, pediculosis, bacterial folliculitis. Discussed trial of anti-dandruff shampoo. Also discussed lifestyle changes- pt takes very hot showers. Return precautions discussed.  Amy Puff, MD [facility] Resident  09/01/2016, 5:00 PM

## 2016-09-01 NOTE — Patient Instructions (Addendum)
Start using an anti-dandruff shampoo like Selsun Blue. Try to avoid taking hot showers, it can worsen your symptoms.  Your symptoms should start to improve in 1 week.  Selenium Sulfide shampoo What is this medicine? SELENIUM SULFIDE (se LEE nee um suhl fahyd) shampoo is used to treat dandruff, fungus infections on the scalp and skin and seborrhea of the scalp. This medicine may be used for other purposes; ask your health care provider or pharmacist if you have questions. COMMON BRAND NAME(S): Anti-Dandruff, Dandrex, Selenos, SelRx, Selseb, Selsun, Selsun Blue What should I tell my health care provider before I take this medicine? They need to know if you have any of these conditions: -any open or inflamed areas of the skin -an unusual or allergic reaction to selenium sulfide, other medicines, foods, dyes, or preservatives -pregnant or trying to get pregnant -breast-feeding How should I use this medicine? This medicine is for external use only. Do not take by mouth. Shake well before using. Follow the directions on the prescription label. Wash your hands before and after use. Do not get this medicine in the eyes. If you do, rinse the eyes out with plenty of cool tap water. Avoid use of this medicine in the genital area. Do not use on inflamed or broken skin. Do not use your medicine more often than directed or for a longer period of time than ordered by your doctor or health care professional. To do so may increase the chance of side effects. Talk to your pediatrician regarding the use of this medicine in children. Special care may be needed. Overdosage: If you think you have taken too much of this medicine contact a poison control center or emergency room at once. NOTE: This medicine is only for you. Do not share this medicine with others. What if I miss a dose? If you miss a dose, use it as soon as you can. If it is almost time for your next dose, use only that dose. Do not use double or extra  doses. What may interact with this medicine? Interactions are not expected. Do not use any other skin products on the affected area without telling your doctor or health care professional. This list may not describe all possible interactions. Give your health care provider a list of all the medicines, herbs, non-prescription drugs, or dietary supplements you use. Also tell them if you smoke, drink alcohol, or use illegal drugs. Some items may interact with your medicine. What should I watch for while using this medicine? Tell your doctor or health care professional if your symptoms do not improve after two weeks. If used on blond, bleached, tinted, grey or permed hair, rinse for at least 5 minutes to minimize the chance for hair discoloration. This medicine should not be used within 48 hours of applying hair color or permanent wave solutions. This medicine may damage jewelry. Remove any jewelry before use. What side effects may I notice from receiving this medicine? Side effects that you should report to your doctor or health care professional as soon as possible: -lack of healing of the skin condition -severe redness or irritation of the skin after using this medicine Side effects that usually do not require medical attention (report to your doctor or health care professional if they continue or are bothersome): -minor skin irritation This list may not describe all possible side effects. Call your doctor for medical advice about side effects. You may report side effects to FDA at 1-800-FDA-1088. Where should I keep my  medicine? Keep out of the reach of children. Store at room temperature between 15 and 30 degrees C (59 and 86 degrees F). Keep tightly closed. Throw away any unused medicine after the expiration date. NOTE: This sheet is a summary. It may not cover all possible information. If you have questions about this medicine, talk to your doctor, pharmacist, or health care provider.  2018  Elsevier/Gold Standard (2008-01-03 15:28:51)    IF you received an x-ray today, you will receive an invoice from [facility]. Please contact [facility] at [phone] with questions or concerns regarding your invoice.   IF you received labwork today, you will receive an invoice from [facility]. Please contact [facility] at [phone] with questions or concerns regarding your invoice.   Our billing staff will not be able to assist you with questions regarding bills from these companies.  You will be contacted with the lab results as soon as they are available. The fastest way to get your results is to activate your My Chart account. Instructions are located on the last page of this paperwork. If you have not heard from [place] regarding the results in 2 weeks, please contact this office.

## 2016-09-04 ENCOUNTER — Ambulatory Visit (INDEPENDENT_AMBULATORY_CARE_PROVIDER_SITE_OTHER): Payer: BC Managed Care – PPO | Admitting: Emergency Medicine

## 2016-09-04 ENCOUNTER — Encounter: Payer: Self-pay | Admitting: Emergency Medicine

## 2016-09-04 VITALS — BP 126/80 | HR 67 | Temp 98.3°F | Resp 16 | Ht 65.0 in | Wt 116.0 lb

## 2016-09-04 DIAGNOSIS — L299 Pruritus, unspecified: Secondary | ICD-10-CM | POA: Insufficient documentation

## 2016-09-04 DIAGNOSIS — L659 Nonscarring hair loss, unspecified: Secondary | ICD-10-CM | POA: Diagnosis not present

## 2016-09-04 DIAGNOSIS — L309 Dermatitis, unspecified: Secondary | ICD-10-CM | POA: Diagnosis not present

## 2016-09-04 MED ORDER — HYDROXYZINE HCL 50 MG PO TABS: 50.0000 mg | ORAL_TABLET | Freq: Every evening | ORAL | 0 refills | Status: AC | PRN

## 2016-09-04 MED ORDER — PREDNISONE 20 MG PO TABS: 20.0000 mg | ORAL_TABLET | Freq: Every day | ORAL | 1 refills | Status: AC

## 2016-09-04 NOTE — Progress Notes (Signed)
Amy Navarro 26 y.o.   Chief Complaint  Patient presents with  . Alopecia    Hair loss, and itchy scalp   . Thrush    White tongue after taking doxycyline for 1 month. finished antibiotic 3 weeks ago    HISTORY OF PRESENT ILLNESS: This is a 26 y.o. female complaining of itchy scalp and partial hair loss; no other significant symptoms.  HPI   Prior to Admission medications   Medication Sig Start Date End Date Taking? Authorizing Provider  albuterol (PROVENTIL) (5 MG/ML) 0.5% nebulizer solution Take 2.5 mg by nebulization every 6 (six) hours as needed for wheezing or shortness of breath.   Yes Historical Provider, MD  amphetamine-dextroamphetamine (ADDERALL) 20 MG tablet Take 20 mg by mouth daily.   Yes Historical Provider, MD  Fluticasone-Salmeterol (ADVAIR) 100-50 MCG/DOSE AEPB Inhale 1 puff into the lungs 2 (two) times daily as needed.    Yes Historical Provider, MD  ISOtretinoin (ACCUTANE) 30 MG capsule Take 30 mg by mouth daily.   Yes Historical Provider, MD  lamoTRIgine (LAMICTAL) 25 MG tablet TK 1 T PO HS FOR 14 DAYS THEN 2 TS HS 06/15/16  Yes Historical Provider, MD  norethindrone-ethinyl estradiol (NECON,BREVICON,MODICON) 0.5-35 MG-MCG tablet Take 1 tablet by mouth daily.   Yes Historical Provider, MD  Vitamin D, Ergocalciferol, (DRISDOL) 50000 units CAPS capsule Take 1 capsule (50,000 Units total) by mouth every 7 (seven) days. 08/03/16  Yes Sherren Mocha, MD  ALPRAZolam Prudy Feeler) 0.5 MG tablet Take 0.5 mg by mouth at bedtime as needed for anxiety.    Historical Provider, MD  cyclobenzaprine (FLEXERIL) 10 MG tablet Take 0.5-1 tablets (5-10 mg total) by mouth 3 (three) times daily as needed for muscle spasms (May cause drowsiness. Do no operate heavy machinery while taking.). Patient not taking: Reported on 09/01/2016 07/05/16   Ofilia Neas, PA-C    Allergies  Allergen Reactions  . Latex Hives, Shortness Of Breath and Rash  . Guaifenesin Er Nausea And Vomiting    Patient  Active Problem List   Diagnosis Date Noted  . Chest pain 09/12/2015  . Palpitations 05/02/2015  . Exercise-induced asthma 04/13/2013  . Raynaud's disease 04/13/2013    Past Medical History:  Diagnosis Date  . Asthma   . Blood transfusion without reported diagnosis   . Raynaud disease     No past surgical history on file.  Social History   Social History  . Marital status: Single    Spouse name: N/A  . Number of children: N/A  . Years of education: N/A   Occupational History  . Not on file.   Social History Main Topics  . Smoking status: Never Smoker  . Smokeless tobacco: Never Used  . Alcohol use 3.0 - 4.8 oz/week    5 Standard drinks or equivalent per week  . Drug use: No  . Sexual activity: Yes    Birth control/ protection: None, Pill   Other Topics Concern  . Not on file   Social History Narrative  . No narrative on file    Family History  Problem Relation Age of Onset  . Hypertension Father   . Hyperlipidemia Father      Review of Systems  Constitutional: Negative.  Negative for chills and fever.  HENT: Negative.   Eyes: Negative.   Respiratory: Negative.   Cardiovascular: Negative.   Gastrointestinal: Negative.   Genitourinary: Negative.   Musculoskeletal: Negative.   Skin: Positive for rash (scalp).  Hair loss   Neurological: Negative.   Endo/Heme/Allergies: Negative.   All other systems reviewed and are negative.    Physical Exam  Constitutional: She is oriented to person, place, and time. She appears well-developed and well-nourished.  HENT:  Head: Normocephalic and atraumatic.  Nose: Nose normal.  Mouth/Throat: Oropharynx is clear and moist.  Eyes: Conjunctivae and EOM are normal. Pupils are equal, round, and reactive to light.  Neck: Normal range of motion. Neck supple. No thyromegaly present.  Cardiovascular: Normal rate and regular rhythm.   Pulmonary/Chest: Effort normal and breath sounds normal.  Musculoskeletal: Normal  range of motion.  Lymphadenopathy:    She has no cervical adenopathy.  Neurological: She is alert and oriented to person, place, and time. No sensory deficit. She exhibits normal muscle tone.  Skin: Skin is warm and dry. Capillary refill takes less than 2 seconds.  Scalp: small areas of scaly rash mostly frontal  Psychiatric: She has a normal mood and affect. Her behavior is normal.  Vitals reviewed.    ASSESSMENT & PLAN: Amy Navarro was seen today for alopecia and thrush.  Diagnoses and all orders for this visit:  Dermatitis Comments: scalp  Hair loss  Itchy scalp  Other orders -     predniSONE (DELTASONE) 20 MG tablet; Take 1 tablet (20 mg total) by mouth daily with breakfast. -     hydrOXYzine (ATARAX/VISTARIL) 50 MG tablet; Take 1 tablet (50 mg total) by mouth at bedtime as needed.   Patient Instructions   Medications as prescribed and f/u with Dermatologist.    IF you received an x-ray today, you will receive an invoice from [facility]. Please contact [facility] at [phone] with questions or concerns regarding your invoice.   IF you received labwork today, you will receive an invoice from [facility]. Please contact [facility] at [phone] with questions or concerns regarding your invoice.   Our billing staff will not be able to assist you with questions regarding bills from these companies.  You will be contacted with the lab results as soon as they are available. The fastest way to get your results is to activate your My Chart account. Instructions are located on the last page of this paperwork. If you have not heard from [place] regarding the results in 2 weeks, please contact this office.         Edwina Barth, MD [facility]

## 2016-09-04 NOTE — Patient Instructions (Addendum)
Medications as prescribed and f/u with Dermatologist.    IF you received an x-ray today, you will receive an invoice from [facility]. Please contact [facility] at [phone] with questions or concerns regarding your invoice.   IF you received labwork today, you will receive an invoice from [facility]. Please contact [facility] at [phone] with questions or concerns regarding your invoice.   Our billing staff will not be able to assist you with questions regarding bills from these companies.  You will be contacted with the lab results as soon as they are available. The fastest way to get your results is to activate your My Chart account. Instructions are located on the last page of this paperwork. If you have not heard from [place] regarding the results in 2 weeks, please contact this office.

## 2016-10-08 ENCOUNTER — Ambulatory Visit: Payer: BC Managed Care – PPO | Admitting: Neurology

## 2016-11-19 ENCOUNTER — Emergency Department (HOSPITAL_COMMUNITY)
Admission: EM | Admit: 2016-11-19 | Discharge: 2016-11-20 | Disposition: A | Discharge status: Home / Self Care | Payer: BC Managed Care – PPO | Attending: Emergency Medicine | Admitting: Emergency Medicine

## 2016-11-19 ENCOUNTER — Encounter (HOSPITAL_COMMUNITY): Payer: Self-pay | Admitting: Emergency Medicine

## 2016-11-19 ENCOUNTER — Emergency Department (HOSPITAL_COMMUNITY): Payer: BC Managed Care – PPO

## 2016-11-19 DIAGNOSIS — R0789 Other chest pain: Secondary | ICD-10-CM

## 2016-11-19 DIAGNOSIS — Z91040 Latex allergy status: Secondary | ICD-10-CM | POA: Insufficient documentation

## 2016-11-19 DIAGNOSIS — R079 Chest pain, unspecified: Secondary | ICD-10-CM | POA: Diagnosis present

## 2016-11-19 DIAGNOSIS — I7300 Raynaud's syndrome without gangrene: Secondary | ICD-10-CM | POA: Insufficient documentation

## 2016-11-19 DIAGNOSIS — Z79899 Other long term (current) drug therapy: Secondary | ICD-10-CM | POA: Insufficient documentation

## 2016-11-19 DIAGNOSIS — J45909 Unspecified asthma, uncomplicated: Secondary | ICD-10-CM | POA: Insufficient documentation

## 2016-11-19 LAB — CBC
HEMATOCRIT: 37.8 % (ref 36.0–46.0)
HEMOGLOBIN: 13.1 g/dL (ref 12.0–15.0)
MCH: 31.3 pg (ref 26.0–34.0)
MCHC: 34.7 g/dL (ref 30.0–36.0)
MCV: 90.2 fL (ref 78.0–100.0)
Platelets: 273 10*3/uL (ref 150–400)
RBC: 4.19 MIL/uL (ref 3.87–5.11)
RDW: 12.2 % (ref 11.5–15.5)
WBC: 8.8 10*3/uL (ref 4.0–10.5)

## 2016-11-19 LAB — I-STAT BETA HCG BLOOD, ED (NOT ORDERABLE)

## 2016-11-19 LAB — BASIC METABOLIC PANEL
Anion gap: 7 (ref 5–15)
BUN: 20 mg/dL (ref 6–20)
CHLORIDE: 104 mmol/L (ref 101–111)
CO2: 28 mmol/L (ref 22–32)
Calcium: 9.6 mg/dL (ref 8.9–10.3)
Creatinine, Ser: 0.83 mg/dL (ref 0.44–1.00)
GFR calc non Af Amer: >60 mL/min (ref >60)
Glucose, Bld: 83 mg/dL (ref 65–99)
POTASSIUM: 3.8 mmol/L (ref 3.5–5.1)
SODIUM: 139 mmol/L (ref 135–145)

## 2016-11-19 LAB — POCT I-STAT TROPONIN I: Troponin i, poc: 0 ng/mL (ref 0.00–0.08)

## 2016-11-19 LAB — D-DIMER, QUANTITATIVE (NOT AT ARMC)

## 2016-11-19 NOTE — ED Triage Notes (Addendum)
Pt from home with c/o right sided cp intermittently yesterday. Pt reports nausea and dizziness with CP. Pt states she has had similar issues in the past and was sent to a cardiologist. Pt states then she was told she had an abnormal rhythm as well as a heart murmur. Pt describes pain as an electric shooting pain. Pt denies radiation of pain. 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 Pt also has hx of asthma and states she has had occasional SOB secondary to that with the recent humidity. Pt has clear lung sounds and normal heart sounds. Pt has strong bilateral radial pulses

## 2016-11-19 NOTE — ED Provider Notes (Signed)
[facility]-EMERGENCY DEPT Provider Note   CSN: [id number] Arrival date & time: 11/19/16  1945     History   Chief Complaint Chief Complaint  Patient presents with  . Chest Pain    HPI Amy Navarro is a 26 y.o. female.  The history is provided by the patient and medical records.    26 y.o. F with hx of asthma, raynaud's disease, Presenting to the ED for chest pain. Patient reports she has history of chest pain about 2 years ago that went on for about a month. She was seen here with a negative workup and was ultimately referred to cardiology. She was told she had a heart murmur as well as some abnormal heartbeats, but was told to follow-up as needed. States she's been doing well over the past 2 years until about 2 days ago. Since then she's been having intermittent chest pain. States it was initially midsternal, but is now more right chest. States pain occurs along with her heartbeat, states it feels sharp and stabbing. She does report associated shortness of breath, dizziness, and nausea when this occurs. States it may last for 10-15 beats and then resolve spontaneously. There are no known alleviating or exacerbating factors. She has no history of coronary artery disease. Reports her grandfather did require a pacemaker. She is not a smoker. Denies any drug use. She had a physical about 3 months ago, thyroid was checked at that time it was normal. She denies any excessive caffeine intake. She does take birth control pills. No recent travel or surgery. She denies history of DVT or PE. Patient denies any pain currently here in ED.  Past Medical History:  Diagnosis Date  . Asthma   . Blood transfusion without reported diagnosis   . Raynaud disease     Patient Active Problem List   Diagnosis Date Noted  . Dermatitis 09/04/2016  . Hair loss 09/04/2016  . Itchy scalp 09/04/2016  . Chest pain 09/12/2015  . Palpitations 05/02/2015  . Exercise-induced asthma 04/13/2013  . Raynaud's disease  04/13/2013    History reviewed. No pertinent surgical history.  OB History    No data available       Home Medications    Prior to Admission medications   Medication Sig Start Date End Date Taking? Authorizing Provider  amphetamine-dextroamphetamine (ADDERALL) 20 MG tablet Take 20 mg by mouth daily.   Yes [provider]  ISOtretinoin (ACCUTANE) 30 MG capsule Take 30 mg by mouth daily.   Yes [provider]  lamoTRIgine (LAMICTAL) 25 MG tablet Take 2 tablets (50 mg) by mouth daily 06/15/16  Yes [provider]  NORTREL 1/35, 28, tablet TK 1 T PO D 10/03/16  Yes [provider]  albuterol (PROVENTIL) (5 MG/ML) 0.5% nebulizer solution Take 2.5 mg by nebulization every 6 (six) hours as needed for wheezing or shortness of breath.    [provider]  ALPRAZolam Prudy Feeler) 0.5 MG tablet Take 0.5 mg by mouth at bedtime as needed for anxiety.    [provider]  cyclobenzaprine (FLEXERIL) 10 MG tablet Take 0.5-1 tablets (5-10 mg total) by mouth 3 (three) times daily as needed for muscle spasms (May cause drowsiness. Do no operate heavy machinery while taking.). Patient not taking: Reported on 09/01/2016 07/05/16   Ofilia Neas, PA-C  Fluticasone-Salmeterol (ADVAIR) 100-50 MCG/DOSE AEPB Inhale 1 puff into the lungs 2 (two) times daily as needed (sob and wheezing).     [provider]  Vitamin D, Ergocalciferol, (DRISDOL)  50000 units CAPS capsule Take 1 capsule (50,000 Units total) by mouth every 7 (seven) days. 08/03/16   Sherren MochaShaw, Eva N, MD    Family History Family History  Problem Relation Age of Onset  . Hypertension Father   . Hyperlipidemia Father     Social History Social History  Substance Use Topics  . Smoking status: Never Smoker  . Smokeless tobacco: Never Used  . Alcohol use 3.0 - 4.8 oz/week    5 Standard drinks or equivalent per week     Allergies   Latex and Guaifenesin er   Review of Systems Review of  Systems  Respiratory: Positive for shortness of breath.   Cardiovascular: Positive for chest pain.  Gastrointestinal: Positive for nausea.  Neurological: Positive for dizziness.  All other systems reviewed and are negative.    Physical Exam Updated Vital Signs BP (!) 155/100 (BP Location: Left Arm)   Pulse 79   Temp 98.8 F (37.1 C) (Oral)   Resp 16   Ht 5\' 5"  (1.651 m)   Wt 52.6 kg (116 lb)   LMP 10/22/2016   SpO2 100%   BMI 19.30 kg/m   Physical Exam  Constitutional: She is oriented to person, place, and time. She appears well-developed and well-nourished.  HENT:  Head: Normocephalic and atraumatic.  Mouth/Throat: Oropharynx is clear and moist.  Eyes: Conjunctivae and EOM are normal. Pupils are equal, round, and reactive to light.  Neck: Normal range of motion.  Cardiovascular: Normal rate, regular rhythm and normal heart sounds.   Pulmonary/Chest: Effort normal and breath sounds normal. No respiratory distress. She has no wheezes.  No significant chest wall tenderness; no deformities, lungs clear  Abdominal: Soft. Bowel sounds are normal. There is no tenderness. There is no rebound.  Musculoskeletal: Normal range of motion.  Neurological: She is alert and oriented to person, place, and time.  Skin: Skin is warm and dry.  Psychiatric: She has a normal mood and affect.  Nursing note and vitals reviewed.    ED Treatments / Results  Labs (all labs ordered are listed, but only abnormal results are displayed) Labs Reviewed  BASIC METABOLIC PANEL  CBC  D-DIMER, QUANTITATIVE (NOT AT [facility])  I-STAT TROPOININ, ED  POCT I-STAT TROPONIN I  I-STAT BETA HCG BLOOD, ED ([facility], [facility], [facility] ONLY)  I-STAT BETA HCG BLOOD, ED (NOT ORDERABLE)    EKG  EKG Interpretation None       Radiology Dg Chest 2 View  Result Date: 11/19/2016 CLINICAL DATA:  Right-sided chest pain. EXAM: CHEST  2 VIEW COMPARISON:  None. FINDINGS: The cardiomediastinal contours are normal. The lungs are clear.  Pulmonary vasculature is normal. No consolidation, pleural effusion, or pneumothorax. No acute osseous abnormalities are seen. IMPRESSION: Normal radiographs of the chest. Electronically Signed   By: Rubye OaksMelanie  Ehinger M.D.   On: 11/19/2016 20:39    Procedures Procedures (including critical care time)  Medications Ordered in ED Medications - No data to display   Initial Impression / Assessment and Plan / ED Course  I have reviewed the triage vital signs and the nursing notes.  Pertinent labs & imaging results that were available during my care of the patient were reviewed by me and considered in my medical decision making (see chart for details).  26 year old female here with chest pain. Has been intermittent over the past 2 days. Initially midsternal, now right-sided. Reports some shortness of breath, dizziness, nausea when this occurs. Has seen cardiology in the past.  EKG without acute ischemic  changes. Lab work including troponin and d-dimer are negative. Pregnancy test is negative as well. Chest x-ray is clear. Patient's vitals have remained stable here. Patient's symptoms are atypical for ACS. Lower suspicion for PE, dissection. 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 Will refer back to cardiology for follow-up, may be Holter monitor.  Discussed plan with patient, she acknowledged understanding and agreed with plan of care.  Return precautions given for new or worsening symptoms.  Final Clinical Impressions(s) / ED Diagnoses   Final diagnoses:  Other chest pain    New Prescriptions New Prescriptions   No medications on file     Garlon Hatchet, PA-C 11/20/16 0040    Lorre Nick, MD 11/23/16 [phone]

## 2016-11-20 NOTE — ED Notes (Signed)
PT DISCHARGED. INSTRUCTIONS GIVEN. AAOX4. PT IN NO APPARENT DISTRESS OR PAIN. THE OPPORTUNITY TO ASK QUESTIONS WAS PROVIDED. 

## 2016-11-20 NOTE — Discharge Instructions (Signed)
As we discussed, your cardiac evaluation today was normal. I recommend that you follow-up with your cardiologist if you have ongoing symptoms. Return to the ED for new or worsening symptoms.

## 2016-11-30 ENCOUNTER — Ambulatory Visit (INDEPENDENT_AMBULATORY_CARE_PROVIDER_SITE_OTHER): Payer: BC Managed Care – PPO | Admitting: Physician Assistant

## 2016-11-30 ENCOUNTER — Encounter: Payer: Self-pay | Admitting: Physician Assistant

## 2016-11-30 VITALS — BP 118/80 | HR 90 | Ht 65.0 in | Wt 117.0 lb

## 2016-11-30 DIAGNOSIS — R002 Palpitations: Secondary | ICD-10-CM | POA: Diagnosis not present

## 2016-11-30 DIAGNOSIS — R079 Chest pain, unspecified: Secondary | ICD-10-CM | POA: Diagnosis not present

## 2016-11-30 NOTE — Progress Notes (Signed)
Cardiology Office Note    Date:  11/30/2016   ID:  Amy Navarro, DOB 12/12/1990, MRN [id number]  PCP:  Ofilia Neaslark, Michael L, PA-C  Cardiologist: Dr. Delton SeeNelson  No chief complaint on file.   History of Present Illness:  Amy Navarro is a 26 y.o. female who is being seen today for the evaluation of  Chest pain  at the request of Sharilyn SitesSanders, Lisa, PA-C from ED. she was seen in the emergency room 11/19/16 for chest pain. She complained of sharp stabbing pain associated with her heartbeat. It would last for 10-15 beats a resolve spontaneously. EKG normal troponins and d-dimer were all negative. 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 She is referred back for possible Holter monitor.  Patient complains of right-sided sharp shooting chest pain that feels like her heart beating but is painful. It always occurs about a week before her period. She's also had several episodes of worsening palpitations before her period and associated dizziness. Prior to going to the ER she was working with the patient became dizzy and presyncopal. Her heart was racing and she became short of breath. When she was in the ER she said the turned the alarm herself so the doctors never realized that happened.She was also running 2-1/2 miles a day but started having dizziness shortness of breath and a flush feeling. She's afraid to run outside and is primarily running on the treadmill. She eats healthy but is not drinking enough water. She's has trouble staying hydrated. She does drink Gatorade after exercise.   This patient saw Dr. Delton SeeNelson 04/2015 for post exertional dizziness and chest pain. 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 She has an identical twin with the same symptoms. Was also having frequent palpitations associated with shortness of breath lasting about 10 minutes. She had normal 2-D echo and MRA at that  time. GXT was normal. It was felt she had postexercise vasodilatation and was advised to hydrate well and use electrolytes prior to exercise and allow slow cooldown post exercise. Chest pain was felt secondary to anxiety. 48 hour monitor showed sinus bradycardia but cardiac to sinus tachycardia with no positives are arrhythmias, normal Holter. She was to follow-up when necessary.    Past Medical History:  Diagnosis Date  . Asthma   . Blood transfusion without reported diagnosis   . Raynaud disease     History reviewed. No pertinent surgical history.  Current Medications: Current Meds  Medication Sig  . albuterol (PROVENTIL) (5 MG/ML) 0.5% nebulizer solution Take 2.5 mg by nebulization every 6 (six) hours as needed for wheezing or shortness of breath.  . amphetamine-dextroamphetamine (ADDERALL) 20 MG tablet Take 20 mg by mouth daily.  . Fluticasone-Salmeterol (ADVAIR) 100-50 MCG/DOSE AEPB Inhale 1 puff into the lungs 2 (two) times daily as needed (sob and wheezing).   . ISOtretinoin (ACCUTANE) 30 MG capsule Take 30 mg by mouth daily.  Marland Kitchen. lamoTRIgine (LAMICTAL) 25 MG tablet Take 2 tablets (50 mg) by mouth daily  . NORTREL 1/35, 28, tablet TK 1 T PO D  . Vitamin D, Ergocalciferol, (DRISDOL) 50000 units CAPS capsule Take 1 capsule (50,000 Units total) by mouth every 7 (seven) days.     Allergies:   Latex and Guaifenesin er   Social History   Social History  . Marital status: Single    Spouse name: N/A  . Number  of children: N/A  . Years of education: N/A   Social History Main Topics  . Smoking status: Never Smoker  . Smokeless tobacco: Never Used  . Alcohol use 3.0 - 4.8 oz/week    5 Standard drinks or equivalent per week  . Drug use: No  . Sexual activity: Yes    Birth control/ protection: None, Pill   Other Topics Concern  . None   Social History Narrative  . None     Family History:  The patient's   family history includes Hyperlipidemia in her father; Hypertension in  her father.   ROS:   Please see the history of present illness.    Review of Systems  Constitution: Negative.  HENT: Negative.   Eyes: Negative.   Cardiovascular: Positive for chest pain, dyspnea on exertion and palpitations.  Respiratory: Negative.   Hematologic/Lymphatic: Negative.   Musculoskeletal: Negative.  Negative for joint pain.  Gastrointestinal: Negative.   Genitourinary: Negative.   Neurological: Positive for dizziness.   All other systems reviewed and are negative.   PHYSICAL EXAM:   VS:  BP 118/80   Pulse 90   Ht 5\' 5"  (1.651 m)   Wt 117 lb (53.1 kg)   SpO2 99%   BMI 19.47 kg/m   Physical Exam  GEN: Well nourished, well developed, in no acute distress  Neck: no JVD, carotid bruits, or masses Cardiac:RRR; 1/6 systolic murmur at the left sternal bo Respiratory:  clear to auscultation bilaterally, normal work of breathing GI: soft, nontender, nondistended, + BS Ext: without cyanosis, clubbing, or edema, Good distal pulses bilaterally Neuro:  Alert and Oriented x 3 Psych: euthymic mood, full affect  Wt Readings from Last 3 Encounters:  11/30/16 117 lb (53.1 kg)  11/19/16 116 lb (52.6 kg)  09/04/16 116 lb (52.6 kg)      Studies/Labs Reviewed:   EKG:  EKG is  ordered today.  The ekg ordered today demonstrates normal sinus rhythm with sinus arrhythmias  EKG reviewed from 11/19/16 normal sinus rhythm normal EKG Recent Labs: 07/31/2016: ALT 12; TSH 2.210 11/19/2016: BUN 20; Creatinine, Ser 0.83; Hemoglobin 13.1; Platelets 273; Potassium 3.8; Sodium 139   Lipid Panel    Component Value Date/Time   CHOL 217 (H) 07/31/2016 1117   TRIG 72 07/31/2016 1117   HDL 102 07/31/2016 1117   CHOLHDL 2.1 07/31/2016 1117   CHOLHDL 3.1 10/02/2014 1702   VLDL 19 10/02/2014 1702   LDLCALC 101 (H) 07/31/2016 1117    Additional studies/ records that were reviewed today include:   Holter monitor 12/27/2016Study Highlights   Sinus bradycardia to sinus tachycardia.  No  pauses or arrhythmias.   Normal Holter monitor.      Cardiac MRA 03/2015 IMPRESSION: 1. Unremarkable thoracic aortic MRA.  No coarctation.     Electronically Signed   By: Corlis Leak M.D.   On: 04/04/2015 17:03 GXT 2016  Clinically negative for chest pain. Test was stopped due to achievement of target HR.  EKG negative for ischemia. No significant arrhythmia noted.   2-D echo 11/2016Study Conclusions   - Left ventricle: The cavity size was normal. Wall thickness was   normal. Systolic function was normal. The estimated ejection   fraction was in the range of 55% to 60%. Wall motion was normal;   there were no regional wall motion abnormalities. Left   ventricular diastolic function parameters were normal.   Impressions:   - Normal LV systolic and diastolic function; no significant   valvular disease.  ASSESSMENT:    1. Chest pain, unspecified type   2. Palpitations      PLAN:  In order of problems listed above:  Chest pain atypical sharp shooting and related to her. Also associated with palpitatio and dyspnea. Had normal GXT 1-1/2 years ago. She is having trouble exercising beca Will repeat GXT.  Palpitations associated with dizziness, shortness of breath and presyncope. She thinks her heart rate got up 160 bpm while in the emergency room When she wore the monitor in the p symptoms. We'll place an event monitor to rule out arrhythmia. I wonder  If she has POTS syndrome. We'll have her follow-up with  Dr. Delton See after test complete.in the meantime recommend hydration with 8 glasses of water  And Gatorade.    Medication Adjustments/Labs and Tests Ordered: Current medicines are reviewed at length with the patient today.  Concerns regarding medicines are outlined above.  Medication changes, Labs and Tests ordered today are listed in the Patient Instructions below. Patient Instructions  Medication Instructions:  1. Your physician recommends that you continue on your  current medications as directed. Please refer to the Current Medication list given to you today.   Labwork: NONE ORDERED  Testing/Procedures: 1. Your physician has recommended that you wear an event monitor. Event monitors are medical devices that record the heart's electrical activity. Doctors most often [place] these monitors to diagnose arrhythmias. Arrhythmias are problems with the speed or rhythm of the heartbeat. The monitor is a small, portable device. You can wear one while you do your normal daily activities. This is usually used to diagnose what is causing palpitations/syncope (passing out).  2. Your physician has requested that you have an exercise tolerance test. For further information please visit [email or web address] Please also follow instruction sheet, as given.    Follow-Up: DR. Delton See IN 1 MONTH PER Aiyonna Lucado, PAC; I WILL HAVE DR. Lindaann Slough NURSE IVY CALL YOU WITH AN APPT.   Any Other Special Instructions Will Be Listed Below (If Applicable).     If you need a refill on your cardiac medications before your next appointment, please call your pharmacy.      Elson Clan, PA-C  11/30/2016 12:55 PM    [facility] [address] [place], [place], [place]  [address] Phone: [phone]; Fax: [phone]

## 2016-11-30 NOTE — Patient Instructions (Signed)
Medication Instructions:  1. Your physician recommends that you continue on your current medications as directed. Please refer to the Current Medication list given to you today.   Labwork: NONE ORDERED  Testing/Procedures: 1. Your physician has recommended that you wear an event monitor. Event monitors are medical devices that record the heart's electrical activity. Doctors most often us these monitors to diagnose arrhythmias. Arrhythmias are problems with the speed or rhythm of the heartbeat. The monitor is a small, portable device. You can wear one while you do your normal daily activities. This is usually used to diagnose what is causing palpitations/syncope (passing out).  2. Your physician has requested that you have an exercise tolerance test. For further information please visit [email or web address]. Please also follow instruction sheet, as given.    Follow-Up: DR. Delton SeeNELSON IN 1 MONTH PER MICHELE LENZE, PAC; I WILL HAVE DR. Lindaann SloughNELSON'S NURSE IVY CALL YOU WITH AN APPT.   Any Other Special Instructions Will Be Listed Below (If Applicable).     If you need a refill on your cardiac medications before your next appointment, please call your pharmacy.

## 2016-12-02 ENCOUNTER — Telehealth: Payer: Self-pay | Admitting: Cardiology

## 2016-12-02 ENCOUNTER — Telehealth: Payer: Self-pay | Admitting: *Deleted

## 2016-12-02 NOTE — Telephone Encounter (Signed)
Pt calling to ask if inflammation of her "sternoclavicular joint" has anything to do with her heart.  Pt states she has been on the internet searching this, and has read a couple articles about this.  Informed the pt that to my knowledge, I am unaware of this correlation.  Informed the pt that future testing (upcoming GXT and event monitor), will be able to further evaluate her heart.  Informed the pt that I will route this information to Dr Delton SeeNelson for further review, and insight if any, and follow-up with the pt accordingly, if needed.  Advised the pt, based on her last OV with Herma CarsonMichelle Lenze Navarro, she should stay plenty hydrated with water or gatorade, eat a nutritious meal, and take slow positional changes, for Amy DusterMichelle is working her up as a POTS pt.  Pt verbalized understanding and agrees with this plan.

## 2016-12-02 NOTE — Telephone Encounter (Signed)
No further advise

## 2016-12-02 NOTE — Telephone Encounter (Signed)
New message  Pt call requesting to speak with RN. Pt states she got an Xray on yesterday and she states she has inflamed joints. Pt call to make an appt but she would like to speak with RN before making the appt. Pt states she feels it could be a orthopedic issue. Please call back to discuss

## 2016-12-02 NOTE — Telephone Encounter (Signed)
Left a message for the pt to call back.  

## 2016-12-02 NOTE — Telephone Encounter (Signed)
-----   Message from Deborah D Miller sent at 12/02/2016  9:51 AM EDT ----- Regarding: RE: NELSON APPT PER MICHELE LENZE, PAC I have called her twice,and lmom. ----- Message ----- From: Ozro Russett M, LPN Sent: 12/01/2016   8:09 AM To: [facility] Subject: FW: NELSON APPT PER MICHELE LENZE, PAC          Can you please schedule this pt a follow-up appt with an Extender on Nelson's team, same day Nelson is in the office, for follow-up in one month per Lenze.  It Follow-up from GXT and possible POTS pt.  Thanks so much,   Simone Rodenbeck   ----- Message ----- From: Fiato, Carol M, CMA Sent: 11/30/2016   1:02 PM To: Mayleigh Tetrault M Warwick Nick, LPN, Jennifer Witty, RMA Subject: NELSON APPT PER MICHELE LENZE, PAC             Hi Rosser [place],  Pt saw Michele Lenze, PA today. Per Michele pt needs to see Dr. Nelson in 1 month. Per Michele please let the pt know of date and time to see Dr. Nelson. I thank you for your time and effort in this matter. Just f/u with Jennifer Witty, CMA.  Thank you Carol     

## 2016-12-03 ENCOUNTER — Telehealth: Payer: Self-pay | Admitting: *Deleted

## 2016-12-03 NOTE — Telephone Encounter (Signed)
I have left Amy Navarro,Three message to call and schedule her appointment with Dr.Neson or the PA.

## 2016-12-09 ENCOUNTER — Ambulatory Visit (INDEPENDENT_AMBULATORY_CARE_PROVIDER_SITE_OTHER): Payer: BC Managed Care – PPO | Admitting: Physician Assistant

## 2016-12-09 ENCOUNTER — Encounter: Payer: Self-pay | Admitting: Physician Assistant

## 2016-12-09 ENCOUNTER — Telehealth: Payer: Self-pay | Admitting: *Deleted

## 2016-12-09 VITALS — BP 128/76 | HR 64 | Temp 97.9°F | Resp 16 | Ht 64.5 in | Wt 119.0 lb

## 2016-12-09 DIAGNOSIS — M62838 Other muscle spasm: Secondary | ICD-10-CM | POA: Diagnosis not present

## 2016-12-09 DIAGNOSIS — M542 Cervicalgia: Secondary | ICD-10-CM | POA: Diagnosis not present

## 2016-12-09 NOTE — Telephone Encounter (Signed)
Pt is scheduled for her GXT and event monitor on 8/1, and follow-up arranged for 9/7 with Dayna Dunn PA-C. Pt [facility].

## 2016-12-09 NOTE — Patient Instructions (Addendum)
Stay well hydrated - drink 1-3 liters of water daily.  Stretch your neck 2-3 times daily.  Apply heat to your neck 20-30 min a time 2-3 times daily.  Chiropractor, massage, acupuncture.  Call if you need Flexeril.   Thank you for coming in today. I hope you feel we met your needs.  Feel free to call PCP if you have any questions or further requests.  Please consider signing up for MyChart if you do not already have it, as this is a great way to communicate with me.  Best,  Whitney McVey, PA-C    IF you received an x-ray today, you will receive an invoice from [facility]. Please contact [facility] at [phone] with questions or concerns regarding your invoice.   IF you received labwork today, you will receive an invoice from [facility]. Please contact [facility] at [phone] with questions or concerns regarding your invoice.   Our billing staff will not be able to assist you with questions regarding bills from these companies.  You will be contacted with the lab results as soon as they are available. The fastest way to get your results is to activate your My Chart account. Instructions are located on the last page of this paperwork. If you have not heard from [place] regarding the results in 2 weeks, please contact this office.

## 2016-12-09 NOTE — Progress Notes (Signed)
Reed PandyCaroline Bowditch  MRN: [id number] DOB: 09/28/1990  PCP: Ofilia Neaslark, Michael L, PA-C  Subjective:  Pt is a 26 year old female who presents to clinic for worsening right neck and shoulder pain x 1 week. She "felt a pop" in the right shoulder 2 days ago. Endorses neck tightness.  She has been taking Diclofenac - not helping much. She has not tried heat or massage. Denies reduced ROM, n/t of extrm, muscle weakness.  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   H/o cervical disc disorder. She has tried Prednisone, flexeril and tylenol. She does not want any of these therapies today.  Review of Systems  Musculoskeletal: Positive for arthralgias (right shoulder) and neck pain. Negative for back pain.  Skin: Negative.   Neurological: Negative for weakness and numbness.    Patient Active Problem List   Diagnosis Date Noted  . Dermatitis 09/04/2016  . Hair loss 09/04/2016  . Itchy scalp 09/04/2016  . Chest pain 09/12/2015  . Palpitations 05/02/2015  . Exercise-induced asthma 04/13/2013  . Raynaud's disease 04/13/2013    Current Outpatient Prescriptions on File Prior to Visit  Medication Sig Dispense Refill  . albuterol (PROVENTIL) (5 MG/ML) 0.5% nebulizer solution Take 2.5 mg by nebulization every 6 (six) hours as needed for wheezing or shortness of breath.    . ALPRAZolam (XANAX) 0.5 MG tablet Take 0.5 mg by mouth at bedtime as needed for anxiety.    Marland Kitchen. amphetamine-dextroamphetamine (ADDERALL) 20 MG tablet Take 20 mg by mouth daily.    . Fluticasone-Salmeterol (ADVAIR) 100-50 MCG/DOSE AEPB Inhale 1 puff into the lungs 2 (two) times daily as needed (sob and wheezing).     . ISOtretinoin (ACCUTANE) 30 MG capsule Take 30 mg by mouth daily.    Marland Kitchen. lamoTRIgine (LAMICTAL) 25 MG tablet Take 2 tablets (50 mg) by mouth daily  1  . NORTREL 1/35, 28, tablet TK 1 T PO D  4  . Vitamin D, Ergocalciferol, (DRISDOL) 50000 units CAPS capsule Take 1 capsule (50,000  Units total) by mouth every 7 (seven) days. 30 capsule 0   No current facility-administered medications on file prior to visit.     Allergies  Allergen Reactions  . Latex Hives, Shortness Of Breath and Rash  . Guaifenesin Er Nausea And Vomiting     Objective:  BP 128/76   Pulse 64   Temp 97.9 F (36.6 C) (Oral)   Resp 16   Ht 5' 4.5" (1.638 m)   Wt 119 lb (54 kg)   LMP 11/22/2016   SpO2 99%   BMI 20.11 kg/m   Physical Exam  Constitutional: She is oriented to person, place, and time and well-developed, well-nourished, and in no distress. No distress.  Cardiovascular: Normal rate, regular rhythm and normal heart sounds.   Musculoskeletal:       Right shoulder: She exhibits spasm. She exhibits normal range of motion, no tenderness, no bony tenderness, no crepitus, no deformity and normal strength.  Neurological: She is alert and oriented to person, place, and time. GCS score is 15.  Skin: Skin is warm and dry.  Psychiatric: Mood, memory, affect and judgment normal.  Vitals reviewed.  Verbal consent obtained. Skin cleaned with alcohol.  A trigger point injection was performed at the site of maximal tenderness using 1% plain Lidocaine. Site covered with bandage. This was well tolerated, and followed by mild relief of pain.  Assessment and Plan :  1. Neck pain 2. Muscle  spasm of right shoulder - Ambulatory referral to Chiropractic - Inject trigger point, 1 or 2 - Trigger point injection tolerated well. Advised heat, massage, stretching, hydration. Plan to refer to chiropractor. RTC in 3-4 weeks if no improvement. She agrees with plan.   Marco CollieWhitney Trennon Torbeck, PA-C  [facility] at [facility] 12/09/2016 5:45 PM

## 2016-12-09 NOTE — Telephone Encounter (Signed)
-----   Message from Gerome Sameborah D Miller sent at 12/02/2016  9:51 AM EDT ----- Regarding: RE: NELSON APPT PER MICHELE LENZE, PAC I have called her twice,and lmom. ----- Message ----- From: Loa SocksMartin, Carigan Lister M, LPN Sent: 6/04/54097/18/2018   8:09 AM To: [facility] Subject: FW: NELSON APPT PER MICHELE LENZE, PAC          Can you please schedule this pt a follow-up appt with an Extender on Nelson's team, same day Delton Seeelson is in the office, for follow-up in one month per Lenze.  It Follow-up from [facility] and possible POTS pt.  Thanks so much,   Jasimine Simms   ----- Message ----- From: Tarri FullerFiato, Carol M, CMA Sent: 11/30/2016   1:02 PM To: Loa SocksIvy M Reather Steller, LPN, Elliot CousinJennifer Witty, RMA Subject: NELSON APPT PER Jacolyn ReedyMICHELE LENZE, PAC             Hi Silvio Sausedo,  Pt saw Jacolyn ReedyMichele Lenze, PA today. Per Elon JesterMichele pt needs to see Dr. Delton SeeNelson in 1 month. Per Elon JesterMichele please let the pt know of date and time to see Dr. Delton SeeNelson. I thank you for your time and effort in this matter. Just f/u with Elliot CousinJennifer Witty, CMA.  Thank you Okey Regalarol

## 2016-12-15 ENCOUNTER — Ambulatory Visit (INDEPENDENT_AMBULATORY_CARE_PROVIDER_SITE_OTHER): Payer: BC Managed Care – PPO

## 2016-12-15 DIAGNOSIS — R079 Chest pain, unspecified: Secondary | ICD-10-CM | POA: Diagnosis not present

## 2016-12-15 DIAGNOSIS — R002 Palpitations: Secondary | ICD-10-CM | POA: Diagnosis not present

## 2016-12-15 LAB — EXERCISE TOLERANCE TEST
CHL CUP MPHR: 195 {beats}/min
CHL CUP RESTING HR STRESS: 83 {beats}/min
CHL RATE OF PERCEIVED EXERTION: 18
CSEPEDS: 0 s
CSEPHR: 94 %
CSEPPHR: 184 {beats}/min
Estimated workload: 13.7 METS
Exercise duration (min): 12 min

## 2016-12-22 ENCOUNTER — Ambulatory Visit (INDEPENDENT_AMBULATORY_CARE_PROVIDER_SITE_OTHER): Payer: BC Managed Care – PPO | Admitting: Emergency Medicine

## 2016-12-22 DIAGNOSIS — M7989 Other specified soft tissue disorders: Secondary | ICD-10-CM

## 2016-12-22 DIAGNOSIS — M79632 Pain in left forearm: Secondary | ICD-10-CM | POA: Insufficient documentation

## 2016-12-22 NOTE — Progress Notes (Signed)
Amy PandyCaroline Navarro 26 y.o.   No chief complaint on file.   HISTORY OF PRESENT ILLNESS: This is a 26 y.o. female complaining of swelling to left medial forearm x several days. Denies trauma, however she may have sustained insect bite followed by rash that went away. Pt presently wearing heart monitor for arrhythmias recently detected. No other significant symptoms.  HPI   Prior to Admission medications   Medication Sig Start Date End Date Taking? Authorizing Provider  albuterol (PROVENTIL) (5 MG/ML) 0.5% nebulizer solution Take 2.5 mg by nebulization every 6 (six) hours as needed for wheezing or shortness of breath.    [provider]  ALPRAZolam Prudy Feeler(XANAX) 0.5 MG tablet Take 0.5 mg by mouth at bedtime as needed for anxiety.    [provider]  amphetamine-dextroamphetamine (ADDERALL) 20 MG tablet Take 20 mg by mouth daily.    [provider]  diclofenac (VOLTAREN) 75 MG EC tablet Take 75 mg by mouth 2 (two) times daily.    [provider]  Fluticasone-Salmeterol (ADVAIR) 100-50 MCG/DOSE AEPB Inhale 1 puff into the lungs 2 (two) times daily as needed (sob and wheezing).     [provider]  ISOtretinoin (ACCUTANE) 30 MG capsule Take 30 mg by mouth daily.    [provider]  lamoTRIgine (LAMICTAL) 25 MG tablet Take 2 tablets (50 mg) by mouth daily 06/15/16   [provider]  NORTREL 1/35, 28, tablet TK 1 T PO D 10/03/16   [provider]  OVER THE COUNTER MEDICATION OTC allergy med    [provider]  Vitamin D, Ergocalciferol, (DRISDOL) 50000 units CAPS capsule Take 1 capsule (50,000 Units total) by mouth every 7 (seven) days. 08/03/16   Sherren MochaShaw, Eva N, MD    Allergies  Allergen Reactions  . Latex Hives, Shortness Of Breath and Rash  . Guaifenesin Er Nausea And Vomiting    Patient Active Problem List   Diagnosis Date Noted  . Dermatitis 09/04/2016  . Hair loss 09/04/2016  . Itchy scalp 09/04/2016  . Chest pain  09/12/2015  . Palpitations 05/02/2015  . Exercise-induced asthma 04/13/2013  . Raynaud's disease 04/13/2013    Past Medical History:  Diagnosis Date  . Asthma   . Blood transfusion without reported diagnosis   . Raynaud disease     No past surgical history on file.  Social History   Social History  . Marital status: Single    Spouse name: N/A  . Number of children: N/A  . Years of education: N/A   Occupational History  . Not on file.   Social History Main Topics  . Smoking status: Never Smoker  . Smokeless tobacco: Never Used  . Alcohol use 3.0 - 4.8 oz/week    5 Standard drinks or equivalent per week  . Drug use: No  . Sexual activity: Yes    Birth control/ protection: None, Pill   Other Topics Concern  . Not on file   Social History Narrative  . No narrative on file    Family History  Problem Relation Age of Onset  . Hypertension Father   . Hyperlipidemia Father      Review of Systems  Constitutional: Negative.  Negative for chills and fever.  HENT: Negative.   Respiratory: Negative for shortness of breath.   Cardiovascular: Positive for palpitations.  Gastrointestinal: Negative for nausea and vomiting.  Musculoskeletal: Negative for joint pain and myalgias.  Skin: Positive for rash (gone now).  Neurological: Negative for sensory change and focal  weakness.  Endo/Heme/Allergies: Negative.   All other systems reviewed and are negative.    Physical Exam  Constitutional: She is oriented to person, place, and time. She appears well-developed and well-nourished.  HENT:  Head: Normocephalic.  Eyes: EOM are normal.  Neck: Normal range of motion.  Pulmonary/Chest: Effort normal.  Musculoskeletal:  LLE: NVI with FROM; mild swelling to mid-medial forearm; no erythema or bruising; non-tender; normal shoulder, elbow, wrist, and hand.  Neurological: She is alert and oriented to person, place, and time.  Skin: Skin is warm and dry. Capillary refill takes  less than 2 seconds.  Psychiatric: She has a normal mood and affect. Her behavior is normal.     ASSESSMENT & PLAN: Diagnoses and all orders for this visit:  Pain and swelling of left forearm Comments: suspected minor trauma   Advised to monitor signs and symptoms and return for re-evaluation if clinical picture changes or worsens.   Edwina Barth, MD [facility]

## 2016-12-29 ENCOUNTER — Encounter: Payer: Self-pay | Admitting: Physician Assistant

## 2017-01-21 ENCOUNTER — Ambulatory Visit (INDEPENDENT_AMBULATORY_CARE_PROVIDER_SITE_OTHER): Payer: BC Managed Care – PPO | Admitting: Physician Assistant

## 2017-01-21 ENCOUNTER — Encounter: Payer: Self-pay | Admitting: Physician Assistant

## 2017-01-21 VITALS — BP 126/70 | HR 62 | Ht 65.0 in | Wt 116.0 lb

## 2017-01-21 DIAGNOSIS — R002 Palpitations: Secondary | ICD-10-CM

## 2017-01-21 DIAGNOSIS — R55 Syncope and collapse: Secondary | ICD-10-CM

## 2017-01-21 DIAGNOSIS — R0789 Other chest pain: Secondary | ICD-10-CM

## 2017-01-21 DIAGNOSIS — I7300 Raynaud's syndrome without gangrene: Secondary | ICD-10-CM

## 2017-01-21 NOTE — Patient Instructions (Signed)
Medication Instructions:  Your physician recommends that you continue on your current medications as directed. Please refer to the Current Medication list given to you today.   Labwork: None ordered  Testing/Procedures: None ordered  Follow-Up: Your physician wants you to follow-up in: 1 YEAR WITH DR. NELSON.  You will receive a reminder letter in the mail two months in advance. If you don't receive a letter, please call our office to schedule the follow-up appointment.   Any Other Special Instructions Will Be Listed Below (If Applicable).     If you need a refill on your cardiac medications before your next appointment, please call your pharmacy.   

## 2017-01-21 NOTE — Progress Notes (Signed)
Cardiology Office Note    Date:  01/21/2017  ID:  Amy Navarro, DOB 1991-04-13, MRN [id number] PCP:  Ofilia Neas, PA-C  Cardiologist:  Dr. Delton See   Chief Complaint: f/u testing  History of Present Illness:  Amy Navarro is a 26 y.o. female with history of asthma, Raynaud's disease, ?POTS who presents for post-testing follow-up. She remotely saw Dr. Delton See in 2016 for post-exertional dizziness and chest pain, bilateral lower extremity tingling, fullness and erythema of the face. 2D echo 03/2015 showed EF 55-60%, otherwise normal. MRA of the chest was negative for coarctation. ETT 03/2015 was normal. 48-hour holter 12/2014 was normal. It was felt that symptoms represented post-exercise vasodilation. She was advised to hydrate well, use electrolyte drinks like Gatorade prior to exercise, and perform slow cool-downs rather than abrupt cessation of exercise. She also had atypical chest pain felt due to anxiety. More recently she saw Amy Reedy PA-C 11/2016 reporting episodes of right-sided shooting/poking chest pain and worsening palpitations/pre-syncope prior to her period. Amy Navarro raised question of possible POTS. ETT 12/2016 was normal. 30-day 12/2016 was completely normal. Recent labs 11/2016 were normal with negative bHCG, d-dimer, CBC, BMET, troponin. Earlier labs showed LDL 101, normal TSH.  She returns for follow-up. She's accompanied by her mother who grew up in [place]. Mom and several family members also have Raynaud's. After her event monitor was turned in she had one day where she felt more lightheaded than normal but overall has had a good month without significant events. Mom notes that episodes of CP tend to happen most when she's out of town. Historically does not have symptoms while exercising.   Past Medical History:  Diagnosis Date  . Asthma   . Blood transfusion without reported diagnosis   . Raynaud disease     No past surgical history on file.  Current  Medications: Current Meds  Medication Sig  . cetirizine (ZYRTEC) 10 MG tablet Take 10 mg by mouth daily.     Allergies:   Latex and Guaifenesin er   Social History   Social History  . Marital status: Single    Spouse name: N/A  . Number of children: N/A  . Years of education: N/A   Social History Main Topics  . Smoking status: Never Smoker  . Smokeless tobacco: Never Used  . Alcohol use 3.0 - 4.8 oz/week    5 Standard drinks or equivalent per week  . Drug use: No  . Sexual activity: Yes    Birth control/ protection: None, Pill   Other Topics Concern  . None   Social History Narrative  . None     Family History:  Family History  Problem Relation Age of Onset  . Hypertension Father   . Hyperlipidemia Father     ROS:   Please see the history of present illness. All other systems are reviewed and otherwise negative.    PHYSICAL EXAM:   VS:  BP 126/70   Pulse 62   Ht  (1.651 m)   Wt 116 lb (52.6 kg)   LMP 01/11/2017   BMI 19.30 kg/m   BMI: Body mass index is 19.3 kg/m. GEN: Well nourished, well developed WF, in no acute distress  HEENT: normocephalic, atraumatic Neck: no JVD, carotid bruits, or masses Cardiac: RRR; no murmurs, rubs, or gallops, no edema  Respiratory:  clear to auscultation bilaterally, normal work of breathing GI: soft, nontender, nondistended, + BS MS: no deformity or atrophy  Skin: warm and dry, no rash  Neuro:  Alert and Oriented x 3, Strength and sensation are intact, follows commands Psych: euthymic mood, full affect  Wt Readings from Last 3 Encounters:  01/21/17 116 lb (52.6 kg)  12/09/16 119 lb (54 kg)  11/30/16 117 lb (53.1 kg)      Studies/Labs Reviewed:   EKG:  EKG was not ordered today  Recent Labs: 07/31/2016: ALT 12; TSH 2.210 11/19/2016: BUN 20; Creatinine, Ser 0.83; Hemoglobin 13.1; Platelets 273; Potassium 3.8; Sodium 139   Lipid Panel    Component Value Date/Time   CHOL 217 (H) 07/31/2016 1117   TRIG 72  07/31/2016 1117   HDL 102 07/31/2016 1117   CHOLHDL 2.1 07/31/2016 1117   CHOLHDL 3.1 10/02/2014 1702   VLDL 19 10/02/2014 1702   LDLCALC 101 (H) 07/31/2016 1117    Additional studies/ records that were reviewed today include: Summarized above.    ASSESSMENT & PLAN:   1. Atypical chest pain - non-cardiac, suspect related to increased visceral awareness. 2. Palpitations/pre-syncope - event monitor completely normal. Interestingly her orthostatics today suggested possible component of POTS, with HR increase from 72 sitting to 100 standing at 6338m. She was not particularly symptomatic today but I wonder if this contributed to prior episodes. Discussed initial measures of volume expansion with adequate regular hydration (including first thing in AM), liberalization of sodium and trial of compression hose. As she is not currently having any symptoms, would hold off on pharmacologic therapy. 3. Raynaud's disease - she does not wish to consider any pharmacologic therapy for this. Discussed conservative measures including avoiding precipitating factors like cold.  Disposition: F/u with Dr. Delton SeeNelson in 1 year. She also reports occasional mild left forearm swelling. I do not appreciate any particular abnormality on exam today and have advised her to f/u further for PCP. MRA was unremarkable.   Medication Adjustments/Labs and Tests Ordered: Current medicines are reviewed at length with the patient today.  Concerns regarding medicines are outlined above. Medication changes, Labs and Tests ordered today are summarized above and listed in the Patient Instructions accessible in Encounters.   Signed, Amy Montanaayna N Dunn, PA-C  01/21/2017 3:40 PM    [facility] [address], [place], [place]  [address] Phone: [phone]; Fax: [phone]

## 2017-01-26 ENCOUNTER — Telehealth: Payer: Self-pay

## 2017-02-01 ENCOUNTER — Encounter: Payer: Self-pay | Admitting: Emergency Medicine

## 2017-02-04 ENCOUNTER — Encounter (HOSPITAL_COMMUNITY): Payer: Self-pay | Admitting: Emergency Medicine

## 2017-02-04 ENCOUNTER — Emergency Department (HOSPITAL_COMMUNITY)
Admission: EM | Admit: 2017-02-04 | Discharge: 2017-02-04 | Disposition: A | Discharge status: Home / Self Care | Payer: BC Managed Care – PPO | Attending: Emergency Medicine | Admitting: Emergency Medicine

## 2017-02-04 DIAGNOSIS — Z91040 Latex allergy status: Secondary | ICD-10-CM | POA: Insufficient documentation

## 2017-02-04 DIAGNOSIS — J45909 Unspecified asthma, uncomplicated: Secondary | ICD-10-CM | POA: Insufficient documentation

## 2017-02-04 DIAGNOSIS — M79604 Pain in right leg: Secondary | ICD-10-CM

## 2017-02-04 HISTORY — DX: Postural orthostatic tachycardia syndrome (POTS): G90.A

## 2017-02-04 HISTORY — DX: Other specified cardiac arrhythmias: I49.8

## 2017-02-04 HISTORY — DX: Orthostatic hypotension: I95.1

## 2017-02-04 HISTORY — DX: Tachycardia, unspecified: R00.0

## 2017-02-04 MED ORDER — NAPROXEN 500 MG PO TABS: 500.0000 mg | ORAL_TABLET | Freq: Two times a day (BID) | ORAL | 0 refills | Status: DC

## 2017-02-04 NOTE — ED Provider Notes (Signed)
[facility] Provider Note   CSN: [id number] Arrival date & time: 02/04/17  1633     History   Chief Complaint Chief Complaint  Patient presents with  . Leg Pain    HPI Amy Navarro is a 26 y.o. female with a history of asthma, POTS, and Raynauds who presents emergent department today for multiple complaints. The patient reports that she has been having several months of intermittent left arm swelling. She thinks this may have started after hitting her arm on a bed while trying to move a patient while at work. The patient is a Engineer, mining. She was seen at [facility] for this and told the symptoms may have been due to a mosquito bite. This has now resolved. She reports that several weeks ago she had right wrist swelling and pain x 4 days. There was no preceding trauma, joint swelling, erythema, heat or other symptoms. This is now resolved. She does note that she had a + Finkelstein when she performed this on herself. She is now complaining of intermittent right leg cramping and swelling over the last few days. The swelling comes and goes throughout the day. She says that it will last for 1-2 hours at a time. She also feels as though the leg is cold at times when compared to the left leg. She says she has not taken anything for her symptoms as she was told it is not anything by previous doctors. The patient denies any rash, or recent tick bite. Says she was bitten by a tick 2 years ago but has had a neg lyme test since. The patient also has had a negative rheumatologic workup in the past. She denies history of G/C, urinary retention of visual changes. The patient deneis fever, chills, chest pain, sob, joint swelling or stiffness, trauma, previous blood clot, recent surgery or travel, or  recent periods of immobilization.    HPI  Past Medical History:  Diagnosis Date  . Asthma   . Blood transfusion without reported diagnosis   . POTS (postural orthostatic tachycardia syndrome)   . Raynaud  disease     Patient Active Problem List   Diagnosis Date Noted  . Pain and swelling of left forearm 12/22/2016  . Dermatitis 09/04/2016  . Hair loss 09/04/2016  . Itchy scalp 09/04/2016  . Chest pain 09/12/2015  . Palpitations 05/02/2015  . Exercise-induced asthma 04/13/2013  . Raynaud's disease 04/13/2013    History reviewed. No pertinent surgical history.  OB History    No data available       Home Medications    Prior to Admission medications   Medication Sig Start Date End Date Taking? Authorizing Provider  albuterol (PROVENTIL) (5 MG/ML) 0.5% nebulizer solution Take 2.5 mg by nebulization every 6 (six) hours as needed for wheezing or shortness of breath.    [provider]  ALPRAZolam Prudy Feeler) 0.5 MG tablet Take 0.5 mg by mouth at bedtime as needed for anxiety.    [provider]  amphetamine-dextroamphetamine (ADDERALL) 20 MG tablet Take 20 mg by mouth daily.    [provider]  cetirizine (ZYRTEC) 10 MG tablet Take 10 mg by mouth daily.    [provider]  Fluticasone-Salmeterol (ADVAIR) 100-50 MCG/DOSE AEPB Inhale 1 puff into the lungs 2 (two) times daily as needed (sob and wheezing).     [provider]  ISOtretinoin (ACCUTANE) 30 MG capsule Take 30 mg by mouth daily.    [provider]  lamoTRIgine (LAMICTAL) 25 MG tablet  Take 2 tablets (50 mg) by mouth daily 06/15/16   [provider]  naproxen (NAPROSYN) 500 MG tablet Take 1 tablet (500 mg total) by mouth 2 (two) times daily. 02/04/17   Bellarose Burtt, Elmer Sow, PA-C  NORTREL 1/35, 28, tablet TK 1 T PO D 10/03/16   [provider]  Vitamin D, Ergocalciferol, (DRISDOL) 50000 units CAPS capsule Take 1 capsule (50,000 Units total) by mouth every 7 (seven) days. 08/03/16   Sherren Mocha, MD    Family History Family History  Problem Relation Age of Onset  . Hypertension Father   . Hyperlipidemia Father     Social History Social History  Substance Use  Topics  . Smoking status: Never Smoker  . Smokeless tobacco: Never Used  . Alcohol use 3.0 - 4.8 oz/week    5 Standard drinks or equivalent per week     Allergies   Latex and Guaifenesin er   Review of Systems Review of Systems  All other systems reviewed and are negative.    Physical Exam Updated Vital Signs BP (!) 143/91 (BP Location: Right Arm)   Pulse 72   Temp (!) 97.3 F (36.3 C) (Oral)   Resp 16   Ht  (1.651 m)   Wt 53.5 kg (118 lb)   LMP 12/28/2016   SpO2 100%   BMI 19.64 kg/m   Physical Exam  Constitutional: She appears well-developed and well-nourished.  HENT:  Head: Normocephalic and atraumatic.  Right Ear: External ear normal.  Left Ear: External ear normal.  Nose: Nose normal.  Mouth/Throat: Uvula is midline, oropharynx is clear and moist and mucous membranes are normal. No tonsillar exudate.  Eyes: Pupils are equal, round, and reactive to light. Right eye exhibits no discharge. Left eye exhibits no discharge. No scleral icterus.  Neck: Trachea normal. Neck supple. No spinous process tenderness present. No neck rigidity. Normal range of motion present.  Cardiovascular: Intact distal pulses.   No murmur heard. Pulses:      Radial pulses are 2+ on the right side, and 2+ on the left side.       Dorsalis pedis pulses are 2+ on the right side, and 2+ on the left side.       Posterior tibial pulses are 2+ on the right side, and 2+ on the left side.  No lower extremity swelling or edema. Calves symmetric in size bilaterally.  Pulmonary/Chest: Effort normal. No respiratory distress.  Abdominal: Soft. Bowel sounds are normal. There is no tenderness. There is no rebound and no guarding.  Musculoskeletal: Normal range of motion. She exhibits no edema.       Right wrist: Normal.       Left wrist: Normal.       Right knee: Normal.       Left knee: Normal.       Right ankle: Normal.       Left ankle: Normal.  Lymphadenopathy:    She has no cervical  adenopathy.  Neurological: She is alert.  Skin: Skin is warm and dry. Capillary refill takes less than 2 seconds. No rash noted. She is not diaphoretic. No erythema.  No blisters, no pustules, no superficial fluctance, erythema, induration or warmth.  No bullous impetigo, no vesicles, no desquamation, no target lesions with dusky purpura or a central bulla. No rash on palms or soles.   Psychiatric: She has a normal mood and affect.  Nursing note and vitals reviewed.    ED Treatments / Results  Labs (all labs ordered are listed, but only abnormal results are displayed) Labs Reviewed - No data to display  EKG  EKG Interpretation None       Radiology No results found.  Procedures Procedures (including critical care time)  Medications Ordered in ED Medications - No data to display   Initial Impression / Assessment and Plan / ED Course  I have reviewed the triage vital signs and the nursing notes.  Pertinent labs & imaging results that were available during my care of the patient were reviewed by me and considered in my medical decision making (see chart for details).     26 year old female with multiple complaints as above. PE reassuring and without obvious indication for patient symptoms. She is symptoms free currently. Patient has had negative rheumatologic workup and negative lyme screening in the past. Her symptoms are not consistent with reactive arthritis (no visual or urinary symptoms). She does not have history of G/C to suggest source of polyarthralgia and she does not have any hot, red, swollen joints on exam. As the patient is symptom free currently, will treat with conservative treatment and have patient follow up with PCP. Patient in agreement and appears safe for discharge.   Final Clinical Impressions(s) / ED Diagnoses   Final diagnoses:  Right leg pain    New Prescriptions Discharge Medication List as of 02/04/2017 10:14 PM    START taking these medications    Details  naproxen (NAPROSYN) 500 MG tablet Take 1 tablet (500 mg total) by mouth 2 (two) times daily., Starting Fri 02/04/2017, Print         Maudie Shingledecker, Elmer Sow, PA-C 02/05/17 1610    Shaune Pollack, MD 02/05/17 [phone]

## 2017-02-04 NOTE — ED Notes (Signed)
Pt st's she does not need the Rx written because she has some at home

## 2017-02-04 NOTE — ED Triage Notes (Signed)
Pt c/o right leg pain and swelling for "a few days". Also c/o left arm swelling, was seen by PCP for arm swelling, was told it was a bug bite. Denies fall/injury/trauma.

## 2017-02-04 NOTE — Discharge Instructions (Signed)
Please read and follow all provided instructions.  Your exam and history was reassuring today. This does not mean that something is not causing your symptoms. We will try trial of anti-inflammatory medication to take at home. Please follow up with PCP so you can be seen by a specialist for further workup.    Tests performed today include: Vital signs. See below for your results today.   Medications prescribed:  Take as prescribed   Home care instructions:  Follow any educational materials contained in this packet.  Follow-up instructions: Please follow-up with your primary care provider for further evaluation of symptoms and treatment. If you do not have one or need to change, there is a number provided you can call.   Return instructions:  Please return to the Emergency Department if you do not get better, if you get worse, or new symptoms OR  - Fever (temperature greater than 101.55F)  - Bleeding that does not stop with holding pressure to the area    -Severe pain (please note that you may be more sore the day after your accident)  - Chest Pain  - Difficulty breathing  - Severe nausea or vomiting  - Inability to tolerate food and liquids  - Passing out  - Skin becoming red around your wounds  - Change in mental status (confusion or lethargy)  - New numbness or weakness    Please return if you have any other emergent concerns.  Additional Information:  Your vital signs today were: BP (!) 144/65 (BP Location: Right Arm)    Pulse 71    Temp (!) 97.3 F (36.3 C) (Oral)    Resp 15    Ht  (1.651 m)    Wt 53.5 kg (118 lb)    LMP 12/28/2016    SpO2 100%    BMI 19.64 kg/m  If your blood pressure (BP) was elevated above 135/85 this visit, please have this repeated by your doctor within one month.

## 2017-02-24 ENCOUNTER — Ambulatory Visit: Payer: BC Managed Care – PPO | Admitting: Physician Assistant

## 2017-03-01 ENCOUNTER — Other Ambulatory Visit: Payer: Self-pay | Admitting: Family Medicine

## 2017-03-02 NOTE — Telephone Encounter (Signed)
Please advise 

## 2017-03-07 NOTE — Telephone Encounter (Signed)
Once rx course of vitamin D is complete, pt should start taking an otc supplement of about 1000-2000u/d.

## 2017-03-08 NOTE — Telephone Encounter (Signed)
lmvm to call me back  

## 2017-03-08 NOTE — Telephone Encounter (Signed)
Due to us playing phone tag I left a detailed message with Dr Alver FisherShaw's instructions to take 1000-2000 units daily of the vitamin d and advised the pt is she has an additional questions or concerns to call us back.

## 2017-03-08 NOTE — Telephone Encounter (Signed)
error 

## 2017-03-10 ENCOUNTER — Ambulatory Visit (INDEPENDENT_AMBULATORY_CARE_PROVIDER_SITE_OTHER): Payer: BC Managed Care – PPO

## 2017-03-10 ENCOUNTER — Ambulatory Visit (INDEPENDENT_AMBULATORY_CARE_PROVIDER_SITE_OTHER): Payer: BC Managed Care – PPO | Admitting: Physician Assistant

## 2017-03-10 ENCOUNTER — Encounter: Payer: Self-pay | Admitting: Physician Assistant

## 2017-03-10 VITALS — BP 122/76 | HR 80 | Resp 16 | Ht 65.0 in | Wt 118.4 lb

## 2017-03-10 DIAGNOSIS — I7300 Raynaud's syndrome without gangrene: Secondary | ICD-10-CM

## 2017-03-10 DIAGNOSIS — M79644 Pain in right finger(s): Secondary | ICD-10-CM

## 2017-03-10 DIAGNOSIS — M7989 Other specified soft tissue disorders: Secondary | ICD-10-CM

## 2017-03-10 DIAGNOSIS — Z114 Encounter for screening for human immunodeficiency virus [HIV]: Secondary | ICD-10-CM

## 2017-03-10 DIAGNOSIS — M2550 Pain in unspecified joint: Secondary | ICD-10-CM

## 2017-03-10 MED ORDER — CYCLOBENZAPRINE HCL 10 MG PO TABS: 5.0000 mg | ORAL_TABLET | Freq: Three times a day (TID) | ORAL | 0 refills | Status: DC | PRN

## 2017-03-10 MED ORDER — MELOXICAM 15 MG PO TABS: 7.5000 mg | ORAL_TABLET | Freq: Every day | ORAL | 0 refills | Status: AC

## 2017-03-10 MED ORDER — TRAMADOL HCL 50 MG PO TABS: 25.0000 mg | ORAL_TABLET | Freq: Three times a day (TID) | ORAL | 0 refills | Status: DC | PRN

## 2017-03-10 MED ORDER — AMLODIPINE BESYLATE 2.5 MG PO TABS: 1.2500 mg | ORAL_TABLET | Freq: Every day | ORAL | 3 refills | Status: DC

## 2017-03-10 NOTE — Progress Notes (Signed)
03/10/2017 5:31 PM   DOB: 12/25/1990 / MRN: [id number]  SUBJECTIVE:  Amy Navarro is a 26 y.o. female presenting for left anterior forearm deformity that she noticed about 4 months ago. There is no pain but she will feel pressure in the area. There is no loss of strength about the area of concern.  RIght intermittant ECP pain has been going on for about 1 year and has tried vitamin D.  Had numbness and tingling which went away with therapy. She has also tried ice and ibuprofen 2 tabs BID.   Right sternoclavicular joint has been causing some pain recently and she did she her ortho about this who tried an NSAID and tells me that this did help but anytime she lifts anything the pain flares back up.   She is allergic to latex and guaifenesin er.   She  has a past medical history of Asthma; Blood transfusion without reported diagnosis; POTS (postural orthostatic tachycardia syndrome); and Raynaud disease.    She  reports that she has never smoked. She has never used smokeless tobacco. She reports that she drinks about 3.0 - 4.8 oz of alcohol per week . She reports that she does not use drugs. She  reports that she currently engages in sexual activity. She reports using the following methods of birth control/protection: None and Pill. The patient  has no past surgical history on file.  Her family history includes Hyperlipidemia in her father; Hypertension in her father.  Review of Systems  Constitutional: Negative for chills, diaphoresis and fever.  Gastrointestinal: Negative for nausea.  Musculoskeletal: Positive for myalgias and neck pain. Negative for back pain, falls and joint pain.  Skin: Negative for rash.  Neurological: Negative for dizziness.    The problem list and medications were reviewed and updated by myself where necessary and exist elsewhere in the encounter.   OBJECTIVE:  BP 122/76 (BP Location: Left Arm, Patient Position: Sitting, Cuff Size: Normal)   Pulse 80   Resp  16   Ht 5\' 5"  (1.651 m)   Wt 118 lb 6.4 oz (53.7 kg)   SpO2 100%   BMI 19.70 kg/m   Physical Exam  Constitutional: She is oriented to person, place, and time. She is active.  Non-toxic appearance.  Eyes: Pupils are equal, round, and reactive to light. EOM are normal.  Cardiovascular: Normal rate, regular rhythm, S1 normal, S2 normal, normal heart sounds and intact distal pulses.  Exam reveals no gallop, no friction rub and no decreased pulses.   No murmur heard. Pulmonary/Chest: Effort normal. No stridor. No tachypnea. No respiratory distress. She has no wheezes. She has no rales.  Abdominal: She exhibits no distension.  Musculoskeletal: She exhibits no edema.  Neurological: She is alert and oriented to person, place, and time. She has normal strength and normal reflexes. She is not disoriented. She displays no atrophy. No cranial nerve deficit or sensory deficit. She exhibits normal muscle tone. Coordination and gait normal.  Skin: Skin is warm and dry. She is not diaphoretic. No pallor.  Psychiatric: Her behavior is normal.    Lab Results  Component Value Date   ALT 12 07/31/2016   AST 15 07/31/2016   ALKPHOS 48 07/31/2016   BILITOT 0.5 07/31/2016   Lab Results  Component Value Date   CREATININE 0.83 11/19/2016   BUN 20 11/19/2016   NA 139 11/19/2016   K 3.8 11/19/2016   CL 104 11/19/2016   CO2 28 11/19/2016   No results  found for this or any previous visit (from the past 72 hour(s)).  No results found.  ASSESSMENT AND PLAN:  Amy Navarro was seen today for edema.  Diagnoses and all orders for this visit:  Thumb pain, right -     DG Hand Complete Right; Future -     Sedimentation Rate -     C-reactive protein -     Cancel: DG Arthro Shoulder Right; Future  Arthralgia, unspecified joint -     cyclobenzaprine (FLEXERIL) 10 MG tablet; Take 0.5-1 tablets (5-10 mg total) by mouth 3 (three) times daily as needed for muscle spasms. Do not mix with narcotics. May cause  drowsiness. -     CBC -     Epstein-Barr virus VCA, IgM -     Lyme Ab/Western Blot Reflex -     meloxicam (MOBIC) 15 MG tablet; Take 0.5-1 tablets (7.5-15 mg total) by mouth daily. Take with food. Do not take Ibuprofen, Goody's, or Aleve while taking this medication. -     traMADol (ULTRAM) 50 MG tablet; Take 0.5-1 tablets (25-50 mg total) by mouth every 8 (eight) hours as needed. -     DG Shoulder Right; Future  Screening for HIV (human immunodeficiency virus) -     HIV antibody  Raynaud's disease without gangrene -     amLODipine (NORVASC) 2.5 MG tablet; Take 0.5 tablets (1.25 mg total) by mouth daily.  Forearm swelling -     VAS [place] UPPER EXTREMITY VENOUS DUPLEX; Future    The patient is advised to call or return to clinic if she does not see an improvement in symptoms, or to seek the care of the closest emergency department if she worsens with the above plan.   Deliah Boston, MHS, PA-C Primary Care at [facility] 03/10/2017 5:31 PM

## 2017-03-10 NOTE — Patient Instructions (Signed)
     IF you received an x-ray today, you will receive an invoice from [facility]. Please contact [facility] at [phone] with questions or concerns regarding your invoice.   IF you received labwork today, you will receive an invoice from [facility]. Please contact [facility] at [phone] with questions or concerns regarding your invoice.   Our billing staff will not be able to assist you with questions regarding bills from these companies.  You will be contacted with the lab results as soon as they are available. The fastest way to get your results is to activate your My Chart account. Instructions are located on the last page of this paperwork. If you have not heard from us regarding the results in 2 weeks, please contact this office.     

## 2017-03-11 LAB — URIC ACID: Uric Acid: 4.1 mg/dL (ref 2.5–7.1)

## 2017-03-14 LAB — C-REACTIVE PROTEIN: CRP: 3.1 mg/L (ref 0.0–4.9)

## 2017-03-14 LAB — HIV ANTIBODY (ROUTINE TESTING W REFLEX): HIV Screen 4th Generation wRfx: NONREACTIVE

## 2017-03-14 LAB — CBC
Hematocrit: 41.2 % (ref 34.0–46.6)
Hemoglobin: 13.8 g/dL (ref 11.1–15.9)
MCH: 30.9 pg (ref 26.6–33.0)
MCHC: 33.5 g/dL (ref 31.5–35.7)
MCV: 92 fL (ref 79–97)
PLATELETS: 328 10*3/uL (ref 150–379)
RBC: 4.47 x10E6/uL (ref 3.77–5.28)
RDW: 12.7 % (ref 12.3–15.4)
WBC: 8 10*3/uL (ref 3.4–10.8)

## 2017-03-14 LAB — LYME AB/WESTERN BLOT REFLEX: LYME DISEASE AB, QUANT, IGM: <0.8 index (ref 0.00–0.79)

## 2017-03-14 LAB — SEDIMENTATION RATE: SED RATE: 2 mm/h (ref 0–32)

## 2017-03-14 LAB — EPSTEIN-BARR VIRUS VCA, IGM

## 2017-03-16 ENCOUNTER — Ambulatory Visit (HOSPITAL_COMMUNITY)
Admission: RE | Admit: 2017-03-16 | Discharge: 2017-03-16 | Disposition: A | Discharge status: Home / Self Care | Payer: BC Managed Care – PPO | Source: Ambulatory Visit | Attending: Vascular Surgery | Admitting: Vascular Surgery

## 2017-03-16 DIAGNOSIS — M7989 Other specified soft tissue disorders: Secondary | ICD-10-CM | POA: Diagnosis present

## 2017-05-21 ENCOUNTER — Other Ambulatory Visit: Payer: Self-pay

## 2017-05-21 ENCOUNTER — Encounter (HOSPITAL_COMMUNITY): Payer: Self-pay | Admitting: Emergency Medicine

## 2017-05-21 ENCOUNTER — Ambulatory Visit (HOSPITAL_COMMUNITY)
Admission: EM | Admit: 2017-05-21 | Discharge: 2017-05-21 | Disposition: A | Discharge status: Home / Self Care | Payer: BC Managed Care – PPO | Attending: Family Medicine | Admitting: Family Medicine

## 2017-05-21 DIAGNOSIS — R591 Generalized enlarged lymph nodes: Secondary | ICD-10-CM

## 2017-05-21 NOTE — ED Triage Notes (Signed)
Patient complains of lymph swollen under right jaw.  Patient has noticed this intermittently.  Patient says this area has been tender and aching for 3 months.

## 2017-05-21 NOTE — ED Provider Notes (Signed)
[facility]    CSN: [id number] Arrival date & time: 05/21/17  1216     History   Chief Complaint Chief Complaint  Patient presents with  . Lymphadenopathy    HPI Amy Navarro is a 27 y.o. female.   Amy Navarro presents with family with multiple concerns, with primary concern related to swollen lymph nodes to right chin and neck. She states they have been swollen off and on for at least the past three months and worse over the past month. She has had a mild runny nose and mild dry cough related to post nasal drip. Without fevers but did feel hot two days ago. She has fatigue. She feels her right arm pit is sweating more than her left. Mild pain to her left clavicle. She states she has a history of anxiety. She has left forearm swelling intermittently, did have an ultrasound for this which was negative. Extensive lab work up 10/25 by her PCP with normal CBC, negative for EBV, HIV and lyme, normal sed rate and crp. She has had mono in the past, states she had it twice. Has taken advil for her symptoms but this has not helped. Pain is 6/10, worse if she palpates the area. Occasional right jaw pain, states she has been clenching her teeth due to anxiety- she is taking board exams and has been studying for these.    ROS per HPI.       Past Medical History:  Diagnosis Date  . Asthma   . Blood transfusion without reported diagnosis   . POTS (postural orthostatic tachycardia syndrome)   . Raynaud disease     Patient Active Problem List   Diagnosis Date Noted  . Pain and swelling of left forearm 12/22/2016  . Dermatitis 09/04/2016  . Hair loss 09/04/2016  . Itchy scalp 09/04/2016  . Chest pain 09/12/2015  . Palpitations 05/02/2015  . Exercise-induced asthma 04/13/2013  . Raynaud's disease 04/13/2013    History reviewed. No pertinent surgical history.  OB History    No data available       Home Medications    Prior to Admission medications   Medication Sig  Start Date End Date Taking? Authorizing Provider  amphetamine-dextroamphetamine (ADDERALL) 20 MG tablet Take 20 mg by mouth daily.   Yes [provider]  cetirizine (ZYRTEC) 10 MG tablet Take 10 mg by mouth daily.   Yes [provider]  lamoTRIgine (LAMICTAL) 25 MG tablet Take 2 tablets (50 mg) by mouth daily 06/15/16  Yes [provider]  NORTREL 1/35, 28, tablet TK 1 T PO D 10/03/16  Yes [provider]  albuterol (PROVENTIL) (5 MG/ML) 0.5% nebulizer solution Take 2.5 mg by nebulization every 6 (six) hours as needed for wheezing or shortness of breath.    [provider]  ALPRAZolam Prudy Feeler(XANAX) 0.5 MG tablet Take 0.5 mg by mouth at bedtime as needed for anxiety.    [provider]  cyclobenzaprine (FLEXERIL) 10 MG tablet Take 0.5-1 tablets (5-10 mg total) by mouth 3 (three) times daily as needed for muscle spasms. Do not mix with narcotics. May cause drowsiness. 03/10/17   Ofilia Neaslark, Michael L, PA-C  Fluticasone-Salmeterol (ADVAIR) 100-50 MCG/DOSE AEPB Inhale 1 puff into the lungs 2 (two) times daily as needed (sob and wheezing).     [provider]  Vitamin D, Ergocalciferol, (DRISDOL) 50000 units CAPS capsule Take 1 capsule (50,000 Units total) by mouth every 7 (seven) days. Patient not taking: Reported on 03/10/2017 08/03/16  Sherren Mocha, MD    Family History Family History  Problem Relation Age of Onset  . Hypertension Father   . Hyperlipidemia Father     Social History Social History   Tobacco Use  . Smoking status: Never Smoker  . Smokeless tobacco: Never Used  Substance Use Topics  . Alcohol use: Yes    Alcohol/week: 3.0 - 4.8 oz    Types: 5 Standard drinks or equivalent per week  . Drug use: No     Allergies   Latex and Guaifenesin er   Review of Systems Review of Systems   Physical Exam Triage Vital Signs ED Triage Vitals  Enc Vitals Group     BP 05/21/17 1308 (!) 146/83     Pulse Rate 05/21/17 1308 86      Resp 05/21/17 1308 18     Temp 05/21/17 1308 98.3 F (36.8 C)     Temp Source 05/21/17 1308 Oral     SpO2 05/21/17 1308 100 %     Weight --      Height --      Head Circumference --      Peak Flow --      Pain Score 05/21/17 1305 6     Pain Loc --      Pain Edu? --      Excl. in GC? --    No data found.  Updated Vital Signs BP (!) 146/83 (BP Location: Right Arm)   Pulse 86   Temp 98.3 F (36.8 C) (Oral)   Resp 18   LMP 05/16/2017   SpO2 100%   Visual Acuity Right Eye Distance:   Left Eye Distance:   Bilateral Distance:    Right Eye Near:   Left Eye Near:    Bilateral Near:     Physical Exam  Constitutional: She is oriented to person, place, and time. She appears well-developed and well-nourished. No distress.  HENT:  Head: Normocephalic and atraumatic.  Right Ear: Tympanic membrane, external ear and ear canal normal.  Left Ear: Tympanic membrane, external ear and ear canal normal.  Nose: Nose normal.  Mouth/Throat: Uvula is midline, oropharynx is clear and moist and mucous membranes are normal. No tonsillar exudate.  Eyes: Conjunctivae and EOM are normal. Pupils are equal, round, and reactive to light.  Neck: Normal range of motion. Neck supple.  Soft mobile palpable lymph bodes bilaterally without significant lymphadenopathy present   Cardiovascular: Normal rate, regular rhythm and normal heart sounds.  Pulmonary/Chest: Effort normal and breath sounds normal.  Neurological: She is alert and oriented to person, place, and time.  Skin: Skin is warm and dry.  Psychiatric:  Patient tearful during discussion of differentials     UC Treatments / Results  Labs (all labs ordered are listed, but only abnormal results are displayed) Labs Reviewed - No data to display  EKG  EKG Interpretation None       Radiology No results found.  Procedures Procedures (including critical care time)  Medications Ordered in UC Medications - No data to  display   Initial Impression / Assessment and Plan / UC Course  I have reviewed the triage vital signs and the nursing notes.  Pertinent labs & imaging results that were available during my care of the patient were reviewed by me and considered in my medical decision making (see chart for details).     Vitals stable. Without any acute physical findings on exam. Discussed differentials with patient and reviewed results from  10/25. Discussed with patient that no present red flag findings today. Continue to work with and follow with PCP for further evaluation as needed. Return precautions provided. Patient verbalized understanding and agreeable to plan.    Final Clinical Impressions(s) / UC Diagnoses   Final diagnoses:  Lymphadenopathy    ED Discharge Orders    None       Controlled Substance Prescriptions [place] Controlled Substance Registry consulted? Not Applicable   Georgetta Haber, NP 05/21/17 1354

## 2017-05-21 NOTE — Discharge Instructions (Signed)
Exam without red flag findings today. Your vital signs are stable. Continue with rest, relaxation, warm packs, ibuprofen as needed for pain, daily antihistamine for allergy symptoms.  If develop increased pain, fevers, difficulty swallowing, rash, facial swelling, difficulty breathing, shortness of breath return to be seen or go to the Er. If your symptoms persist without improvement in the next 1-2 weeks please follow up with your primary care provider for further evaluation as indicated.

## 2017-05-31 LAB — HM PAP SMEAR: HM PAP: ABNORMAL

## 2017-06-28 ENCOUNTER — Encounter: Payer: Self-pay | Admitting: Physician Assistant

## 2017-06-28 ENCOUNTER — Ambulatory Visit: Payer: BC Managed Care – PPO | Admitting: Physician Assistant

## 2017-06-28 ENCOUNTER — Other Ambulatory Visit: Payer: Self-pay

## 2017-06-28 VITALS — BP 132/80 | HR 81 | Temp 98.1°F | Resp 16 | Ht 65.0 in | Wt 116.4 lb

## 2017-06-28 DIAGNOSIS — M79602 Pain in left arm: Secondary | ICD-10-CM

## 2017-06-28 DIAGNOSIS — R591 Generalized enlarged lymph nodes: Secondary | ICD-10-CM

## 2017-06-28 DIAGNOSIS — Z1321 Encounter for screening for nutritional disorder: Secondary | ICD-10-CM | POA: Diagnosis not present

## 2017-06-28 DIAGNOSIS — Z130 Encounter for screening for diseases of the blood and blood-forming organs and certain disorders involving the immune mechanism: Secondary | ICD-10-CM

## 2017-06-28 NOTE — Patient Instructions (Addendum)
You will receive a phone call to schedule an appointment with rheumatology and neurology.  We will contact you with the results of your lab work.   Thank you for coming in today. I hope you feel we met your needs.  Feel free to call PCP if you have any questions or further requests.  Please consider signing up for MyChart if you do not already have it, as this is a great way to communicate with me.  Best,  Whitney McVey, PA-C  IF you received an x-ray today, you will receive an invoice from [facility]. Please contact [facility] at [phone] with questions or concerns regarding your invoice.   IF you received labwork today, you will receive an invoice from [facility]. Please contact [facility] at [phone] with questions or concerns regarding your invoice.   Our billing staff will not be able to assist you with questions regarding bills from these companies.  You will be contacted with the lab results as soon as they are available. The fastest way to get your results is to activate your My Chart account. Instructions are located on the last page of this paperwork. If you have not heard from [place] regarding the results in 2 weeks, please contact this office.

## 2017-06-28 NOTE — Progress Notes (Signed)
Reed PandyCaroline Gayman  MRN: [id number] DOB: 05/11/1991  PCP: Ofilia Neaslark, Michael L, PA-C  Subjective:  Pt is a 27 year old female PMH raynaud's disease, asthma who presents to clinic for arm pain. She endorses arm swelling x 6 months.  C/o fatigued and swollen lymph nodes x 6 months, pinning and tingling in right index finger and random numbness and tingling in both hands  She was seen at urgent care 1/5 for swollen lymph nodes for chin and neck. Assessment from that OV: "Discussed with patient that no present red flag findings today. Continue to work with and follow with PCP for further evaluation as needed."   Extensive lab work up 10/25 by her PCP with normal CBC, negative for EBV, HIV and lyme, normal sed rate and crp. She has had mono in the past, states she had it twice.  Review of Systems  Constitutional: Negative for activity change, appetite change, diaphoresis, fatigue and unexpected weight change.  Musculoskeletal: Positive for arthralgias, myalgias and neck pain. Negative for back pain, gait problem and neck stiffness.  Neurological: Positive for numbness. Negative for dizziness, weakness, light-headedness and headaches.    Patient Active Problem List   Diagnosis Date Noted  . Pain and swelling of left forearm 12/22/2016  . Dermatitis 09/04/2016  . Hair loss 09/04/2016  . Itchy scalp 09/04/2016  . Chest pain 09/12/2015  . Palpitations 05/02/2015  . Exercise-induced asthma 04/13/2013  . Raynaud's disease 04/13/2013    Current Outpatient Medications on File Prior to Visit  Medication Sig Dispense Refill  . albuterol (PROVENTIL) (5 MG/ML) 0.5% nebulizer solution Take 2.5 mg by nebulization every 6 (six) hours as needed for wheezing or shortness of breath.    . ALPRAZolam (XANAX) 0.5 MG tablet Take 0.5 mg by mouth at bedtime as needed for anxiety.    Marland Kitchen. amphetamine-dextroamphetamine (ADDERALL) 20 MG tablet Take 20 mg by mouth daily.    . cetirizine (ZYRTEC) 10 MG tablet Take 10 mg  by mouth daily.    . cyclobenzaprine (FLEXERIL) 10 MG tablet Take 0.5-1 tablets (5-10 mg total) by mouth 3 (three) times daily as needed for muscle spasms. Do not mix with narcotics. May cause drowsiness. 60 tablet 0  . Fluticasone-Salmeterol (ADVAIR) 100-50 MCG/DOSE AEPB Inhale 1 puff into the lungs 2 (two) times daily as needed (sob and wheezing).     Marland Kitchen. lamoTRIgine (LAMICTAL) 25 MG tablet Take 2 tablets (50 mg) by mouth daily  1  . NORTREL 1/35, 28, tablet TK 1 T PO D  4  . Vitamin D, Ergocalciferol, (DRISDOL) 50000 units CAPS capsule Take 1 capsule (50,000 Units total) by mouth every 7 (seven) days. (Patient not taking: Reported on 03/10/2017) 30 capsule 0   No current facility-administered medications on file prior to visit.     Allergies  Allergen Reactions  . Latex Hives, Shortness Of Breath and Rash  . Guaifenesin Er Nausea And Vomiting     Objective:  BP 132/80 (BP Location: Right Arm, Patient Position: Sitting, Cuff Size: Normal)   Pulse 81   Temp 98.1 F (36.7 C) (Oral)   Resp 16   Ht 5\' 5"  (1.651 m)   Wt 116 lb 6.4 oz (52.8 kg)   LMP 06/14/2017   SpO2 100%   BMI 19.37 kg/m   Physical Exam  Constitutional: She is oriented to person, place, and time and well-developed, well-nourished, and in no distress. No distress.  Cardiovascular: Normal rate, regular rhythm and normal heart sounds.  Musculoskeletal:  Left forearm: She exhibits tenderness and swelling. She exhibits no edema and no deformity.  Left anterolateral forearm mildly more enlarged than right. TTP muscles of left forearm, especially medial epicondyle.   Lymphadenopathy:    She has no cervical adenopathy.    She has no axillary adenopathy.  Neurological: She is alert and oriented to person, place, and time. GCS score is 15.  Skin: Skin is warm and dry.  Psychiatric: Mood, memory, affect and judgment normal.  tearful  Vitals reviewed.   03/10/2017 DG shoulder right - normal exam 03/10/2017 DG hand  complete right - No acute osseous abnormalities. [place] left forearm negative.  07/31/2016 DG C-spine - Negative cervical spine radiographs.   Assessment and Plan :  1. Encounter for vitamin deficiency screening - Vitamin B12 - VITAMIN D 25 Hydroxy (Vit-D Deficiency, Fractures) - pt presents with an interesting constellation of complaints which she has been trying to find an answer for x 6 months. She is tearful today as she just wants to know what's going on . She has been here multiple times for these complaints with extensive lab work and x-rays - all negative. She was once referred to neurology, however could not make that appt. She never got the US neck. I think a referral would be a great idea, and will put in a referral to neurology and rheumatology for evaluation.  2. Screening for blood disease - CBC with Differential/Platelet  3. Lymphadenopathy - Ambulatory referral to Neurology - Ambulatory referral to Rheumatology  4. Arm pain, left   Marco Collie, PA-C  Primary Care at [facility] 06/28/2017 5:27 PM

## 2017-06-29 ENCOUNTER — Encounter: Payer: Self-pay | Admitting: Neurology

## 2017-06-29 LAB — CBC WITH DIFFERENTIAL/PLATELET
Basophils Absolute: 0 10*3/uL (ref 0.0–0.2)
Basos: 0 %
EOS (ABSOLUTE): 0 10*3/uL (ref 0.0–0.4)
Eos: 0 %
Hematocrit: 42.3 % (ref 34.0–46.6)
Hemoglobin: 14.4 g/dL (ref 11.1–15.9)
Immature Grans (Abs): 0 10*3/uL (ref 0.0–0.1)
Immature Granulocytes: 0 %
Lymphocytes Absolute: 3.2 10*3/uL — ABNORMAL HIGH (ref 0.7–3.1)
Lymphs: 32 %
MCH: 31.2 pg (ref 26.6–33.0)
MCHC: 34 g/dL (ref 31.5–35.7)
MCV: 92 fL (ref 79–97)
Monocytes Absolute: 0.8 10*3/uL (ref 0.1–0.9)
Monocytes: 8 %
Neutrophils Absolute: 5.8 10*3/uL (ref 1.4–7.0)
Neutrophils: 60 %
Platelets: 346 10*3/uL (ref 150–379)
RBC: 4.61 x10E6/uL (ref 3.77–5.28)
RDW: 12.8 % (ref 12.3–15.4)
WBC: 9.8 10*3/uL (ref 3.4–10.8)

## 2017-06-29 LAB — VITAMIN B12: Vitamin B-12: 434 pg/mL (ref 232–1245)

## 2017-06-29 LAB — VITAMIN D 25 HYDROXY (VIT D DEFICIENCY, FRACTURES): Vit D, 25-Hydroxy: 84.9 ng/mL (ref 30.0–100.0)

## 2017-06-30 NOTE — Progress Notes (Signed)
Results wnl. Released to mychart.

## 2017-07-04 ENCOUNTER — Encounter: Payer: Self-pay | Admitting: Physician Assistant

## 2017-07-05 ENCOUNTER — Ambulatory Visit: Payer: Self-pay | Admitting: Emergency Medicine

## 2017-07-05 ENCOUNTER — Telehealth: Payer: Self-pay | Admitting: Physician Assistant

## 2017-07-05 ENCOUNTER — Ambulatory Visit (HOSPITAL_COMMUNITY)
Admission: RE | Admit: 2017-07-05 | Discharge: 2017-07-05 | Disposition: A | Discharge status: Home / Self Care | Payer: BC Managed Care – PPO | Source: Ambulatory Visit | Attending: Emergency Medicine | Admitting: Emergency Medicine

## 2017-07-05 ENCOUNTER — Other Ambulatory Visit (HOSPITAL_COMMUNITY): Payer: Self-pay | Admitting: Emergency Medicine

## 2017-07-05 ENCOUNTER — Telehealth: Payer: Self-pay | Admitting: Emergency Medicine

## 2017-07-05 VITALS — BP 130/80 | HR 81 | Temp 97.9°F | Resp 20

## 2017-07-05 DIAGNOSIS — M79662 Pain in left lower leg: Secondary | ICD-10-CM | POA: Diagnosis not present

## 2017-07-05 DIAGNOSIS — M7989 Other specified soft tissue disorders: Principal | ICD-10-CM

## 2017-07-05 DIAGNOSIS — M79605 Pain in left leg: Secondary | ICD-10-CM

## 2017-07-05 DIAGNOSIS — I7300 Raynaud's syndrome without gangrene: Secondary | ICD-10-CM

## 2017-07-05 MED ORDER — AMLODIPINE BESYLATE 5 MG PO TABS: 5.0000 mg | ORAL_TABLET | Freq: Every day | ORAL | 0 refills | Status: DC

## 2017-07-05 NOTE — Telephone Encounter (Signed)
Notified Reed PandyCaroline Camper of her venous ultrasounds results, recommend rest, NSAIDS, continue CCBs, follow-up with PCP if symptoms persist, recommend seeking treatment at the ER anytime symptoms worsen.

## 2017-07-05 NOTE — Patient Instructions (Signed)
Raynaud Phenomenon Raynaud phenomenon is a condition that affects the blood vessels (arteries) that carry blood to your fingers and toes. The arteries that supply blood to your ears or the tip of your nose might also be affected. Raynaud phenomenon causes the arteries to temporarily narrow. As a result, the flow of blood to the affected areas is temporarily decreased. This usually occurs in response to cold temperatures or stress. During an attack, the skin in the affected areas turns white. You may also feel tingling or numbness in those areas. Attacks usually last for only a brief period, and then the blood flow to the area returns to normal. In most cases, Raynaud phenomenon does not cause serious health problems. What are the causes? For many people with this condition, the cause is not known. Raynaud phenomenon is sometimes associated with other diseases, such as scleroderma or lupus. What increases the risk? Raynaud phenomenon can affect anyone, but it develops most often in people who are 20-40 years old. It affects more females than males. What are the signs or symptoms? Symptoms of Raynaud phenomenon may occur when you are exposed to cold temperatures or when you have emotional stress. The symptoms may last for a few minutes or up to several hours. They usually affect your fingers but may also affect your toes, ears, or the tip of your nose. Symptoms may include:  Changes in skin color. The skin in the affected areas will turn pale or white. The skin may then change from white to bluish to red as normal blood flow returns to the area.  Numbness, tingling, or pain in the affected areas.  In severe cases, sores may develop in the affected areas. How is this diagnosed? Your health care provider will do a physical exam and take your medical history. You may be asked to put your hands in cold water to check for a reaction to cold temperature. Blood tests may be done to check for other diseases or  conditions. Your health care provider may also order a test to check the movement of blood through your arteries and veins (vascular ultrasound). How is this treated? Treatment often involves making lifestyle changes and taking steps to control your exposure to cold temperatures. For more severe cases, medicine (calcium channel blockers) may be used to improve blood flow. Surgery is sometimes done to block the nerves that control the affected arteries, but this is rare. Follow these instructions at home:  Avoid exposure to cold by taking these steps: ? If possible, stay indoors during cold weather. ? When you go outside during cold weather, dress in layers and wear mittens, a hat, a scarf, and warm footwear. ? Wear mittens or gloves when handling ice or frozen food. ? Use holders for glasses or cans containing cold drinks. ? Let warm water run for a while before taking a shower or bath. ? Warm up the car before driving in cold weather.  If possible, avoid stressful and emotional situations. Exercise, meditation, and yoga may help you cope with stress. Biofeedback may be useful.  Do not use any tobacco products, including cigarettes, chewing tobacco, or electronic cigarettes. If you need help quitting, ask your health care provider.  Avoid secondhand smoke.  Limit your use of caffeine. Switch to decaffeinated coffee, tea, and soda. Avoid chocolate.  Wear loose fitting socks and comfortable, roomy shoes.  Avoid vibrating tools and machinery.  Take medicines only as directed by your health care provider. Contact a health care provider if:    Your discomfort becomes worse despite lifestyle changes.  You develop sores on your fingers or toes that do not heal.  Your fingers or toes turn black.  You have breaks in the skin on your fingers or toes.  You have a fever.  You have pain or swelling in your joints.  You have a rash.  Your symptoms occur on only one side of your body. This  information is not intended to replace advice given to you by your health care provider. Make sure you discuss any questions you have with your health care provider. Document Released: 04/30/2000 Document Revised: 10/09/2015 Document Reviewed: 11/05/2015 Elsevier Interactive Patient Education  2017 Elsevier Inc.  

## 2017-07-05 NOTE — Telephone Encounter (Signed)
Copied from CRM [phone]. Topic: Quick Communication - See Telephone Encounter >> Jul 05, 2017  1:08 PM Landry MellowFoltz, Melissa J wrote: CRM for notification. See Telephone encounter for:   07/05/17. Pt wants to know if she is getting her mri through the neurologist , or is it done separately? If it done through the neurologist, then py will have to wait for 4 months, and pt needs mri done sooner.  Pt requesting call back please  Cb [phone]

## 2017-07-05 NOTE — Progress Notes (Signed)
other

## 2017-07-05 NOTE — Progress Notes (Signed)
Left lower extremity venous duplex has been completed. Negative for DVT. Results were given to Dorena BodoLawrence Kennard NP.  07/05/17 12:58 PM Olen CordialGreg Martavius Lusty RVT

## 2017-07-05 NOTE — Progress Notes (Signed)
S: Amy Navarro is a 27 y.o. female who presents with a chief complaint of left leg swelling and pain. She denies traumatic cause, has had no recent trips, no recent hospitalizations, does not smoke, denies pregnancy, does take OCPs, she endorses a possible history of clotting disorder stating her grandfather may have had one, but she is uncertain as to the diagnosis. She has past history of Raynauds and upper extremity pain, but reports no prior issues with lower extremities. She has taken Naprosyn with minimal relief.   Review of Systems  Constitutional: Negative for chills and fever.  Respiratory: Negative for cough, hemoptysis, sputum production and shortness of breath.   Cardiovascular: Positive for leg swelling. Negative for chest pain, palpitations, orthopnea and claudication.  Gastrointestinal: Negative for abdominal pain, nausea and vomiting.  Musculoskeletal: Positive for myalgias.  Neurological: Negative.      O: Vitals:   07/05/17 1129  BP: 130/80  Pulse: 81  Resp: 20  Temp: 97.9 F (36.6 C)  SpO2: 100%   Physical Exam  Constitutional: She is oriented to person, place, and time. She appears well-developed and well-nourished.  HENT:  Head: Normocephalic.  Right Ear: External ear normal.  Left Ear: External ear normal.  Cardiovascular: Normal rate and regular rhythm.  Pulses:      Radial pulses are 2+ on the right side.       Dorsalis pedis pulses are 2+ on the right side, and 2+ on the left side.       Posterior tibial pulses are 2+ on the right side, and 2+ on the left side.  Pulmonary/Chest: Effort normal and breath sounds normal.  Musculoskeletal:       Right shoulder: She exhibits pain.       Left lower leg: She exhibits tenderness.  Tape measure unavailable for bilateral ankle/calf comparisons. Right leg normal Left leg, TTP calf, positive Holmans sign Left leg notably cooler than right, Pulses present and equal bilaterally   Neurological: She is alert and  oriented to person, place, and time.  Nursing note and vitals reviewed.   A: 1. Left leg pain   2. Raynaud's phenomenon without gangrene     P: 27 y.o. female with multiple chronic conditions including Raynaud's presenting with complaint of left leg swelling and pain, worsening with ambulation, and cool to touch. Skin color is normal with no delay on capillary refill. Well's Criteria for DVT is 1 as she does take OCPs. Doubt traumatic injury based on HPI. [facility] and start on Amlodipine for Raynauds. Follow up guidelines provided to seek care at the ER for worsening symptoms.

## 2017-07-05 NOTE — Telephone Encounter (Signed)
Called and spoke with pt to confirm apt tomorrow 07/06/17. Advised of time, building # and time policies. °

## 2017-07-06 ENCOUNTER — Telehealth: Payer: Self-pay

## 2017-07-06 ENCOUNTER — Ambulatory Visit: Payer: BC Managed Care – PPO | Admitting: Physician Assistant

## 2017-07-06 ENCOUNTER — Other Ambulatory Visit: Payer: Self-pay

## 2017-07-06 ENCOUNTER — Encounter: Payer: Self-pay | Admitting: Physician Assistant

## 2017-07-06 VITALS — BP 162/104 | HR 110 | Resp 16 | Ht 65.0 in | Wt 115.2 lb

## 2017-07-06 DIAGNOSIS — F411 Generalized anxiety disorder: Secondary | ICD-10-CM | POA: Diagnosis not present

## 2017-07-06 MED ORDER — ALPRAZOLAM 0.5 MG PO TABS: 0.5000 mg | ORAL_TABLET | Freq: Every evening | ORAL | 0 refills | Status: DC | PRN

## 2017-07-06 NOTE — Progress Notes (Signed)
07/06/2017 3:37 PM   DOB: 04/07/1991 / MRN: [id number]  SUBJECTIVE:  Amy Navarro is a 27 y.o. female presenting for pain.  She continues to have several vague complaints of swelling, pain all over.  She recently failed her boards for occupational therapy.  She complains of her alcoholic father.  She is concerned about her sister's health.  She is concerned why it is so difficult for her to achieve anything in life and why she has to work so hard.  She is tearful as she relates these concerns.  She wants to be tested for hemochromatosis because her grandfather had that diagnosis.  I informed her that her CBC was normal and hemochromatosis was extremely unlikely.  I reviewed all of her lab work with her today and advised there is very little left to check.  She admits to being very sad lately and does feel depressed as well as anxious.  She is taking lamotrigine prescribed by her psychiatrist as a mood stabilizer.  She is out of her Xanax at this time.  She is willing to talk to her psychiatrist about the symptoms.  She is allergic to latex and guaifenesin er.   She  has a past medical history of Asthma, Blood transfusion without reported diagnosis, POTS (postural orthostatic tachycardia syndrome), and Raynaud disease.    She  reports that  has never smoked. she has never used smokeless tobacco. She reports that she drinks about 3.0 - 4.8 oz of alcohol per week. She reports that she does not use drugs. She  reports that she currently engages in sexual activity. She reports using the following methods of birth control/protection: None and Pill. The patient  has no past surgical history on file.  Her family history includes Hyperlipidemia in her father; Hypertension in her father.  Review of Systems  Constitutional: Positive for malaise/fatigue and weight loss.  Cardiovascular: Positive for chest pain and leg swelling.  Gastrointestinal: Positive for abdominal pain.  Musculoskeletal: Positive for  joint pain and myalgias.  Neurological: Positive for dizziness and weakness.    The problem list and medications were reviewed and updated by myself where necessary and exist elsewhere in the encounter.   OBJECTIVE:  BP (!) 162/104 (BP Location: Right Arm, Patient Position: Sitting, Cuff Size: Normal)   Pulse (!) 110   Resp 16   Ht 5\' 5"  (1.651 m)   Wt 115 lb 3.2 oz (52.3 kg)   LMP 06/14/2017   SpO2 100%   BMI 19.17 kg/m   Physical Exam  Constitutional: She is active.  Non-toxic appearance.  Cardiovascular: Normal rate, regular rhythm, S1 normal, S2 normal, normal heart sounds and intact distal pulses. Exam reveals no gallop, no friction rub and no decreased pulses.  No murmur heard. Pulmonary/Chest: Effort normal. No stridor. No tachypnea. No respiratory distress. She has no wheezes. She has no rales.  Abdominal: She exhibits no distension.  Musculoskeletal: Normal range of motion. She exhibits no edema, tenderness or deformity.  Neurological: She is alert.  Skin: Skin is warm and dry. She is not diaphoretic. No pallor.  Psychiatric: Judgment normal. Her mood appears anxious. Her affect is angry and blunt. Her affect is not labile and not inappropriate. Her speech is not delayed, not tangential and not slurred. She is agitated and aggressive. She is not actively hallucinating. Cognition and memory are normal. She exhibits a depressed mood. She is communicative. She is attentive.    Lab Results  Component Value Date   WBC  9.8 06/28/2017   HGB 14.4 06/28/2017   HCT 42.3 06/28/2017   MCV 92 06/28/2017   PLT 346 06/28/2017     No results found for this or any previous visit (from the past 72 hour(s)).  No results found.  ASSESSMENT AND PLAN:  Amy Navarro was seen today for arm pain and joint swelling.  Diagnoses and all orders for this visit:  Anxiety state: She needs to take something for depression and anxiety.  I have offered to start her on Lexapro.  She declines this  at this time and would like to talk with her psychiatrist.  Advised I would be happy to talk with her about this diagnosis in the future. -     ALPRAZolam (XANAX) 0.5 MG tablet; Take 1 tablet (0.5 mg total) by mouth at bedtime as needed for anxiety.    The patient is advised to call or return to clinic if she does not see an improvement in symptoms, or to seek the care of the closest emergency department if she worsens with the above plan.   Deliah Boston, MHS, PA-C Primary Care at [facility] 07/06/2017 3:37 PM

## 2017-07-06 NOTE — Patient Instructions (Signed)
     IF you received an x-ray today, you will receive an invoice from [facility]. Please contact [facility] at [phone] with questions or concerns regarding your invoice.   IF you received labwork today, you will receive an invoice from [facility]. Please contact [facility] at [phone] with questions or concerns regarding your invoice.   Our billing staff will not be able to assist you with questions regarding bills from these companies.  You will be contacted with the lab results as soon as they are available. The fastest way to get your results is to activate your My Chart account. Instructions are located on the last page of this paperwork. If you have not heard from us regarding the results in 2 weeks, please contact this office.     

## 2017-07-06 NOTE — Telephone Encounter (Signed)
I left a message asking the patient to return the call. 

## 2017-07-08 NOTE — Telephone Encounter (Signed)
Cannot see documentation recommending MRI on 06/28/17 visit, provider please advise. Of note, patient saw Deliah BostonMichael Clark on 07/06/17 for mood.

## 2017-07-11 NOTE — Telephone Encounter (Signed)
Pt needs eval by neuro to determine whether she needs MRI. When I last saw this pt there were no indications she needs an MRI at this time. Thank you.

## 2017-07-12 NOTE — Telephone Encounter (Signed)
Pt has been sent to [facility], however, they have scheduled the pt for 10/05/17. I have sent referral to [facility] to see if they can get pt in any sooner. Neurology can book out for months.

## 2017-07-12 NOTE — Telephone Encounter (Signed)
Attempted call to pt to update on status of MRI/Neuro appt.  L/m to c/b.

## 2017-07-13 ENCOUNTER — Telehealth: Payer: Self-pay

## 2017-07-13 ENCOUNTER — Encounter: Payer: Self-pay | Admitting: Physician Assistant

## 2017-07-13 DIAGNOSIS — I7300 Raynaud's syndrome without gangrene: Secondary | ICD-10-CM

## 2017-07-13 DIAGNOSIS — M2550 Pain in unspecified joint: Secondary | ICD-10-CM

## 2017-07-13 NOTE — Telephone Encounter (Signed)
Per patient mother and provider, future labs ordered.

## 2017-07-14 MED ORDER — TRAMADOL HCL 50 MG PO TABS: 50.0000 mg | ORAL_TABLET | Freq: Three times a day (TID) | ORAL | 1 refills | Status: DC | PRN

## 2017-07-19 ENCOUNTER — Ambulatory Visit: Payer: Self-pay

## 2017-07-19 DIAGNOSIS — M2550 Pain in unspecified joint: Secondary | ICD-10-CM

## 2017-07-19 DIAGNOSIS — I7300 Raynaud's syndrome without gangrene: Secondary | ICD-10-CM

## 2017-07-20 ENCOUNTER — Ambulatory Visit (INDEPENDENT_AMBULATORY_CARE_PROVIDER_SITE_OTHER): Payer: No Typology Code available for payment source

## 2017-07-20 ENCOUNTER — Ambulatory Visit: Payer: No Typology Code available for payment source | Admitting: Family Medicine

## 2017-07-20 ENCOUNTER — Other Ambulatory Visit: Payer: Self-pay

## 2017-07-20 ENCOUNTER — Encounter: Payer: Self-pay | Admitting: Family Medicine

## 2017-07-20 VITALS — BP 128/78 | HR 89 | Temp 97.9°F | Resp 18 | Ht 65.0 in | Wt 112.8 lb

## 2017-07-20 DIAGNOSIS — R634 Abnormal weight loss: Secondary | ICD-10-CM | POA: Diagnosis not present

## 2017-07-20 DIAGNOSIS — I7300 Raynaud's syndrome without gangrene: Secondary | ICD-10-CM | POA: Diagnosis not present

## 2017-07-20 DIAGNOSIS — M79605 Pain in left leg: Secondary | ICD-10-CM

## 2017-07-20 DIAGNOSIS — R600 Localized edema: Secondary | ICD-10-CM

## 2017-07-20 MED ORDER — METHYLPREDNISOLONE ACETATE 80 MG/ML IJ SUSP: 120.0000 mg | Freq: Once | INTRAMUSCULAR | Status: AC

## 2017-07-20 MED ORDER — CYANOCOBALAMIN 1000 MCG/ML IJ SOLN
1000.0000 ug | Freq: Once | INTRAMUSCULAR | Status: AC
  Administered 2017-07-20: 1000 ug via INTRAMUSCULAR

## 2017-07-20 NOTE — Patient Instructions (Addendum)
IF you received an x-ray today, you will receive an invoice from [facility]. Please contact [facility] at [phone] with questions or concerns regarding your invoice.   IF you received labwork today, you will receive an invoice from [facility]. Please contact [facility] at [phone] with questions or concerns regarding your invoice.   Our billing staff will not be able to assist you with questions regarding bills from these companies.  You will be contacted with the lab results as soon as they are available. The fastest way to get your results is to activate your My Chart account. Instructions are located on the last page of this paperwork. If you have not heard from us regarding the results in 2 weeks, please contact this office.    Complex Regional Pain Syndrome Complex regional pain syndrome (CRPS) is a nerve disorder that causes long-lasting (chronic) pain, usually in a hand, arm, leg, or foot. CRPS usually follows an injury or trauma, such as a fracture or sprain. There are two types of CRPS:  Type 1. This type occurs after an injury or trauma with no known damage to a nerve.  Type 2. This type occurs after injury or trauma damages a nerve.  There are three stages of the condition:  Stage 1. This stage, called the acute stage, may last for three months.  Stage 2. This stage, called the dystrophic stage, may last for three to 12 months.  Stage 3. This stage, called the atrophic stage, may start after one year.  CRPS ranges from mild to severe. For most people CRPS is mild and recovery happens over time. For others, CRPS lasts a very long time and is debilitating. What are the causes? The exact cause of CRPS is not known. What increases the risk? You may be at increased risk if:  You are a woman.  You are approximately 27 years of age.  You have any of the following: ? A family history of CRPS. ? An injury or surgery. ? An  infection. ? Cancer. ? Neck problems. ? A stroke. ? A heart attack. ? Asthma.  What are the signs or symptoms? Signs and symptoms in the affected limb are different for each stage. Signs and symptoms of stage 1 include:  Burning pain.  A pins and needles sensation.  Extremely sensitive skin.  Swelling.  Joint stiffness.  Warmth and redness.  Excessive sweating.  Hair and nail growth that is faster than normal.  Signs and symptoms of stage 2 include:  Spreading of pain to the whole limb.  Increased skin sensitivity.  Increased swelling and stiffness.  Coolness of the skin.  Blue discoloration of skin.  Loss of skin wrinkles.  Brittle fingernails.  Signs and symptoms of stage 3 include:  Pain that spreads to other areas of the body but becomes less severe.  More stiffness, leading to loss of motion.  Skin that is pale, dry, shiny, and tightly stretched.  How is this diagnosed? There is no test to diagnose CRPS. Your health care provider will make a diagnosis based on your signs and symptoms and a physical exam. The exam may include tests to rule out other possible causes of your symptoms. Sometimes imaging tests are done, such as an MRI or bone scan. These tests check for bone changes that might indicate CRPS. How is this treated? Early treatment may prevent CRPS from advancing past stage 1. There is no one treatment that works for everyone. Treatment options may include:  Medicines, such as: ? Nonsteroidal-anti-inflammatory drugs (NSAIDS). ? Steroids. ? Blood pressure drugs. ? Antidepressants. ? Anti-seizure drugs. ? Pain relievers.  Exercise.  Occupational and physical therapy.  Biofeedback.  Mental health counseling.  Numbing injections.  Spinal surgery to implant a spinal cord stimulator or a pain pump.  Follow these instructions at home:  Take medicines only as directed by your health care provider.  Follow an exercise program as  directed by your health care provider.  Maintain a healthy weight.  Keep all follow-up visits as directed by your health care provider. This is important. Contact a health care provider if:  Your symptoms change.  Your symptoms get worse.  You develop anxiety or depression. This information is not intended to replace advice given to you by your health care provider. Make sure you discuss any questions you have with your health care provider. Document Released: 04/23/2002 Document Revised: 10/09/2015 Document Reviewed: 01/28/2014 Elsevier Interactive Patient Education  [employer].

## 2017-07-20 NOTE — Progress Notes (Addendum)
Subjective:  07/20/2017 , 3:55 PM .  Patient was seen in Room 1 .   Patient ID: Amy Navarro, female    DOB: 01-Oct-1990, 27 y.o.   MRN: [id number] Chief Complaint  Patient presents with  . Leg Injury    x1 month, left leg, pt states she fell into a coffee table because of her dog. Pt states pain goes from behind the left knee and down into her calf.   HPI Amy Navarro is a 27 y.o. female who presents to Primary Care at [facility] complaining of left leg pain after falling into a coffee table about a month ago. Patient describes tripping and hitting the lateral side of her left knee against the edge of the coffee table, with severe pain at the time of onset. Several days after the injury, patient describes her leg becoming cold, pale and swollen. She tried to be seen here, but had to go to [facility] (see EMR note 07/05/2017), where they did venous doppler that was negative. She was started on amlodipine 5mg  at that time, suspected that her Raynaud's was the cause; although prior, her Raynaud's was symmetrical. It wasn't until several days later, she associated to the fall and injury.    Past Medical History:  Diagnosis Date  . Asthma   . Blood transfusion without reported diagnosis   . POTS (postural orthostatic tachycardia syndrome)   . Raynaud disease    History reviewed. No pertinent surgical history. Prior to Admission medications   Medication Sig Start Date End Date Taking? Authorizing Provider  albuterol (PROVENTIL) (5 MG/ML) 0.5% nebulizer solution Take 2.5 mg by nebulization every 6 (six) hours as needed for wheezing or shortness of breath.    [provider]  ALPRAZolam Prudy Feeler) 0.5 MG tablet Take 1 tablet (0.5 mg total) by mouth at bedtime as needed for anxiety. 07/06/17   Ofilia Neas, PA-C  amLODipine (NORVASC) 5 MG tablet Take 1 tablet (5 mg total) by mouth daily. 07/05/17   Dorena Bodo, NP  amphetamine-dextroamphetamine (ADDERALL) 20 MG tablet Take 20 mg by  mouth daily.    [provider]  cetirizine (ZYRTEC) 10 MG tablet Take 10 mg by mouth daily.    [provider]  cyclobenzaprine (FLEXERIL) 10 MG tablet Take 0.5-1 tablets (5-10 mg total) by mouth 3 (three) times daily as needed for muscle spasms. Do not mix with narcotics. May cause drowsiness. 03/10/17   Ofilia Neas, PA-C  Fluticasone-Salmeterol (ADVAIR) 100-50 MCG/DOSE AEPB Inhale 1 puff into the lungs 2 (two) times daily as needed (sob and wheezing).     [provider]  lamoTRIgine (LAMICTAL) 25 MG tablet Take 2 tablets (50 mg) by mouth daily 06/15/16   [provider]  NORTREL 1/35, 28, tablet TK 1 T PO D 10/03/16   [provider]  traMADol (ULTRAM) 50 MG tablet Take 1 tablet (50 mg total) by mouth every 8 (eight) hours as needed. 07/14/17   Ofilia Neas, PA-C  Vitamin D, Ergocalciferol, (DRISDOL) 50000 units CAPS capsule Take 1 capsule (50,000 Units total) by mouth every 7 (seven) days. 08/03/16   Sherren Mocha, MD   Allergies  Allergen Reactions  . Latex Hives, Shortness Of Breath and Rash  . Guaifenesin Er Nausea And Vomiting   Family History  Problem Relation Age of Onset  . Hypertension Father   . Hyperlipidemia Father    Social History   Socioeconomic History  . Marital status: Single    Spouse name: None  .  Number of children: None  . Years of education: None  . Highest education level: None  Social Needs  . Financial resource strain: None  . Food insecurity - worry: None  . Food insecurity - inability: None  . Transportation needs - medical: None  . Transportation needs - non-medical: None  Occupational History  . None  Tobacco Use  . Smoking status: Never Smoker  . Smokeless tobacco: Never Used  Substance and Sexual Activity  . Alcohol use: Yes    Alcohol/week: 3.0 - 4.8 oz    Types: 5 Standard drinks or equivalent per week  . Drug use: No  . Sexual activity: Yes    Birth control/protection: None, Pill  Other  Topics Concern  . None  Social History Narrative  . None   Depression screen [facility] 2/9 07/20/2017 07/06/2017 06/28/2017 03/10/2017 12/09/2016  Decreased Interest 0 0 0 0 0  Down, Depressed, Hopeless 0 0 0 0 0  PHQ - 2 Score 0 0 0 0 0    Review of Systems  Constitutional: Negative for chills, fatigue, fever and unexpected weight change.  Respiratory: Negative for cough.   Gastrointestinal: Negative for constipation, diarrhea, nausea and vomiting.  Musculoskeletal: Positive for arthralgias and myalgias.  Skin: Negative for rash and wound.  Neurological: Negative for dizziness, weakness and headaches.       Objective:   Physical Exam  Constitutional: She is oriented to person, place, and time. She appears well-developed and well-nourished. No distress.  HENT:  Head: Normocephalic and atraumatic.  Eyes: EOM are normal. Pupils are equal, round, and reactive to light.  Neck: Neck supple.  Cardiovascular: Normal rate.  Pulmonary/Chest: Effort normal. No respiratory distress.  Musculoskeletal: Normal range of motion.  Neurological: She is alert and oriented to person, place, and time.  Skin: Skin is warm and dry.  Psychiatric: She has a normal mood and affect. Her behavior is normal.  Nursing note and vitals reviewed.   BP 128/78 (BP Location: Right Arm, Patient Position: Sitting, Cuff Size: Normal)   Pulse 89   Temp 97.9 F (36.6 C) (Oral)   Resp 18   Ht 5\' 5"  (1.651 m)   Wt 112 lb 12.8 oz (51.2 kg)   LMP 07/04/2017   SpO2 99%   BMI 18.77 kg/m   Dg Ankle Complete Left  Result Date: 07/20/2017 CLINICAL DATA:  27 y/o F; 1 month prior fell. Pain to the calcaneus radiating to the posterior calf and knee. EXAM: LEFT FOOT - COMPLETE 3+ VIEW; LEFT ANKLE COMPLETE - 3+ VIEW COMPARISON:  None. FINDINGS: Left foot: There is no evidence of fracture or dislocation. There is no evidence of arthropathy or other focal bone abnormality. Soft tissues are unremarkable. Left ankle: There is no  evidence of fracture or dislocation. There is no evidence of arthropathy or other focal bone abnormality. Soft tissues are unremarkable. IMPRESSION: Negative. Electronically Signed   By: Mitzi HansenLance  Furusawa-Stratton M.D.   On: 07/20/2017 16:54   Dg Knee Complete 4 Views Left  Result Date: 07/20/2017 CLINICAL DATA:  27 y/o F; 1 month prior fell. Pain to the calcaneus radiating to the posterior calf and knee. EXAM: LEFT KNEE - COMPLETE 4+ VIEW COMPARISON:  None. FINDINGS: No evidence of fracture, dislocation, or joint effusion. No evidence of arthropathy or other focal bone abnormality. Soft tissues are unremarkable. IMPRESSION: Negative. Electronically Signed   By: Mitzi HansenLance  Furusawa-Stratton M.D.   On: 07/20/2017 16:55   Dg Foot Complete Left  Result Date: 07/20/2017 CLINICAL  DATA:  27 y/o F; 1 month prior fell. Pain to the calcaneus radiating to the posterior calf and knee. EXAM: LEFT FOOT - COMPLETE 3+ VIEW; LEFT ANKLE COMPLETE - 3+ VIEW COMPARISON:  None. FINDINGS: Left foot: There is no evidence of fracture or dislocation. There is no evidence of arthropathy or other focal bone abnormality. Soft tissues are unremarkable. Left ankle: There is no evidence of fracture or dislocation. There is no evidence of arthropathy or other focal bone abnormality. Soft tissues are unremarkable. IMPRESSION: Negative. Electronically Signed   By: Mitzi Hansen M.D.   On: 07/20/2017 16:54       Assessment & Plan:  Kinds of go crazy of prednisone pills but does better on steroid shots though she has never noticed improvement before on there steroid shots.   Has bad tachycardia and it causes BP to sky-rocket rather than plummet - she hasn't taken it, but she doesn't feel well. Doesn't get lightheaded really - has just happened once or twice. Has never been on any nerve pain medications. Has been really fatigued so going to bed very early, always tired. Really severe fatigue for the past 3 weeks.  Boards finished  2/26  1. Acute leg pain, left   2. Unintentional weight loss   3. Localized edema   4. Raynaud's disease without gangrene     Orders Placed This Encounter  Procedures  . DG Foot Complete Left    Standing Status:   Future    Number of Occurrences:   1    Standing Expiration Date:   07/20/2018    Order Specific Question:   Reason for Exam (SYMPTOM  OR DIAGNOSIS REQUIRED)    Answer:   tender to palpation, pain throughout calcaneus radiating up posterior calf to knee, 1 mo prior fell w/ lateral knee into coffee table and hyperextended    Order Specific Question:   Is the patient pregnant?    Answer:   No    Order Specific Question:   Preferred imaging location?    Answer:   External  . DG Ankle Complete Left    Standing Status:   Future    Number of Occurrences:   1    Standing Expiration Date:   07/20/2018    Order Specific Question:   Reason for Exam (SYMPTOM  OR DIAGNOSIS REQUIRED)    Answer:   tender to palpation, pain throughout calcaneus radiating up posterior calf to knee, 1 mo prior fell w/ lateral knee into coffee table and hyperextended    Order Specific Question:   Is the patient pregnant?    Answer:   No    Order Specific Question:   Preferred imaging location?    Answer:   External  . DG Knee Complete 4 Views Left    Standing Status:   Future    Number of Occurrences:   1    Standing Expiration Date:   07/20/2018    Order Specific Question:   Reason for Exam (SYMPTOM  OR DIAGNOSIS REQUIRED)    Answer:   tender to palpation, pain throughout calcaneus radiating up posterior calf to knee, 1 mo prior fell w/ lateral knee into coffee table and hyperextended    Order Specific Question:   Is the patient pregnant?    Answer:   No    Order Specific Question:   Preferred imaging location?    Answer:   External  . Uric A+ANA+RA Qn+CRP+ASO  . Sedimentation Rate  . CBC with Differential/Platelet  .  Comprehensive metabolic panel  . TSH+T4F+T3Free    Meds ordered this encounter    Medications  . cyanocobalamin ((VITAMIN B-12)) injection 1,000 mcg  . methylPREDNISolone acetate (DEPO-MEDROL) injection 120 mg     Norberto Sorenson, M.D.  Primary Care at [facility] [address] [place], [place] [address] [phone] phone [phone] fax  07/23/17 9:42 AM

## 2017-07-21 LAB — SEDIMENTATION RATE: Sed Rate: 2 mm/hr (ref 0–32)

## 2017-07-21 LAB — CBC WITH DIFFERENTIAL/PLATELET
Basophils Absolute: 0 10*3/uL (ref 0.0–0.2)
Basos: 0 %
EOS (ABSOLUTE): 0.1 10*3/uL (ref 0.0–0.4)
EOS: 1 %
HEMATOCRIT: 40.2 % (ref 34.0–46.6)
Hemoglobin: 13.5 g/dL (ref 11.1–15.9)
IMMATURE GRANS (ABS): 0 10*3/uL (ref 0.0–0.1)
IMMATURE GRANULOCYTES: 0 %
LYMPHS: 37 %
Lymphocytes Absolute: 2.7 10*3/uL (ref 0.7–3.1)
MCH: 31.1 pg (ref 26.6–33.0)
MCHC: 33.6 g/dL (ref 31.5–35.7)
MCV: 93 fL (ref 79–97)
MONOS ABS: 0.5 10*3/uL (ref 0.1–0.9)
Monocytes: 6 %
NEUTROS PCT: 56 %
Neutrophils Absolute: 4.1 10*3/uL (ref 1.4–7.0)
PLATELETS: 262 10*3/uL (ref 150–379)
RBC: 4.34 x10E6/uL (ref 3.77–5.28)
RDW: 12.4 % (ref 12.3–15.4)
WBC: 7.3 10*3/uL (ref 3.4–10.8)

## 2017-07-21 LAB — TSH+T4F+T3FREE
FREE T4: 1.45 ng/dL (ref 0.82–1.77)
T3, Free: 2.9 pg/mL (ref 2.0–4.4)
TSH: 3.4 u[IU]/mL (ref 0.450–4.500)

## 2017-07-21 LAB — COMPREHENSIVE METABOLIC PANEL
ALT: 12 IU/L (ref 0–32)
AST: 13 IU/L (ref 0–40)
Albumin/Globulin Ratio: 1.8 (ref 1.2–2.2)
Albumin: 4.6 g/dL (ref 3.5–5.5)
Alkaline Phosphatase: 47 IU/L (ref 39–117)
BUN/Creatinine Ratio: 23 (ref 9–23)
BUN: 18 mg/dL (ref 6–20)
Bilirubin Total: <0.2 mg/dL (ref 0.0–1.2)
CO2: 24 mmol/L (ref 20–29)
CREATININE: 0.8 mg/dL (ref 0.57–1.00)
Calcium: 9.9 mg/dL (ref 8.7–10.2)
Chloride: 103 mmol/L (ref 96–106)
GFR calc Af Amer: 118 mL/min/{1.73_m2} (ref >59)
GFR, EST NON AFRICAN AMERICAN: 102 mL/min/{1.73_m2} (ref >59)
GLOBULIN, TOTAL: 2.6 g/dL (ref 1.5–4.5)
Glucose: 65 mg/dL (ref 65–99)
Potassium: 4 mmol/L (ref 3.5–5.2)
Sodium: 140 mmol/L (ref 134–144)
TOTAL PROTEIN: 7.2 g/dL (ref 6.0–8.5)

## 2017-07-21 LAB — URIC A+ANA+RA QN+CRP+ASO
ASO: 120 IU/mL (ref 0.0–200.0)
Anti Nuclear Antibody(ANA): NEGATIVE
CRP: 2.1 mg/L (ref 0.0–4.9)
Uric Acid: 3.7 mg/dL (ref 2.5–7.1)

## 2017-07-22 ENCOUNTER — Encounter: Payer: Self-pay | Admitting: Physician Assistant

## 2017-07-22 LAB — ALLERGENS(96) FOODS
Allergen Black Pepper IgE: <0.1 kU/L
Allergen Blueberry IgE: <0.1 kU/L
Allergen Broccoli: <0.1 kU/L
Allergen Cabbage IgE: <0.1 kU/L
Allergen Carrot IgE: <0.1 kU/L
Allergen Cauliflower IgE: <0.1 kU/L
Allergen Cinnamon IgE: <0.1 kU/L
Allergen Coconut IgE: <0.1 kU/L
Allergen Corn, IgE: <0.1 kU/L
Allergen Cucumber IgE: <0.1 kU/L
Allergen Garlic IgE: <0.1 kU/L
Allergen Grape IgE: <0.1 kU/L
Allergen Grapefruit IgE: <0.1 kU/L
Allergen Green Bell Pepper IgE: <0.1 kU/L
Allergen Lamb IgE: <0.1 kU/L
Allergen Lime IgE: <0.1 kU/L
Allergen Melon IgE: <0.1 kU/L
Allergen Oat IgE: <0.1 kU/L
Allergen Pear IgE: <0.1 kU/L
Allergen Turkey IgE: <0.1 kU/L
Allergen Watermelon IgE: <0.1 kU/L
Basil: <0.1 kU/L
C074-IgE Gelatin: <0.1 kU/L
Chicken IgE: <0.1 kU/L
Clam IgE: <0.1 kU/L
Cranberry IgE: <0.1 kU/L
Egg White IgE: <0.1 kU/L
F020-IgE Almond: <0.1 kU/L
F023-IgE Crab: <0.1 kU/L
F077-IgE Beta Lactoglobulin: <0.1 kU/L
F081-IgE Cheese, Cheddar Type: <0.1 kU/L
F089-IgE Mustard: <0.1 kU/L
F214-IgE Spinach: <0.1 kU/L
F247-IgE Honey: <0.1 kU/L
F262-IgE Eggplant: <0.1 kU/L
F278-IgE Bayleaf (Laurel): <0.1 kU/L
F279-IgE Chili Pepper: <0.1 kU/L
F300-IgE Goat's Milk: <0.1 kU/L
F342-IgE Olive, Black: <0.1 kU/L
F343-IgE Raspberry: <0.1 kU/L
Hops: <0.1 kU/L
Kidney Bean IgE: <0.1 kU/L
Lemon: <0.1 kU/L
Mushroom IgE: <0.1 kU/L
Peanut IgE: <0.1 kU/L
Pumpkin IgE: <0.1 kU/L
Sesame Seed IgE: <0.1 kU/L
Shrimp IgE: <0.1 kU/L
Soybean IgE: <0.1 kU/L
Tuna: <0.1 kU/L
Walnut IgE: <0.1 kU/L
Whey: <0.1 kU/L
White Bean IgE: <0.1 kU/L

## 2017-07-22 LAB — CALPROTECTIN, FECAL

## 2017-07-24 ENCOUNTER — Encounter: Payer: Self-pay | Admitting: Family Medicine

## 2017-08-02 NOTE — Progress Notes (Signed)
Office Visit Note  Patient: Amy Navarro             Date of Birth: 1990-07-15           MRN: [id number]             PCP: Hillis Range Referring: Desiree Lucy* Visit Date: 08/16/2017 Occupation: Occupational therapist at [employer]    Subjective:  Raynauds and joint pain.Marland Kitchen   History of Present Illness: Amy Navarro is a 28 y.o. female seen in consultation per request of her her PCP.  According to patient she has had Raynauds for multiple years.  She believes her rainouts is getting worse that she is getting older.  In June 2018 she started having right sternoclavicular joint swelling.  She states she was seen by an orthopedic surgeon who did some x-rays but no treatment was given.  In July 2018 she had right forearm swelling for which she was seen by her PCP.  She states she had x-rays which were unremarkable.  She also had some right CMC discomfort for which she had x-rays as well.  In September 2018 she developed left lower extremity swelling and cold sensation for which she was referred to [facility] where she had ultrasound scan which was negative.  She is to recalls another episode in September 2018 when she went to the emergency room for the lower extremity swelling and the swelling resolved by the time she was seen.  Since March 2018 she has been having intermittent pain in her right elbow joint and right second MCP joint.  She also gives history of some redness in her scalp and hair loss off and on.  She states she was seen by dermatologist but no diagnosis was given.  Activities of Daily Living:  Patient reports morning stiffness for 0 minute.   Patient Denies nocturnal pain.  Difficulty dressing/grooming: Denies Difficulty climbing stairs: Denies Difficulty getting out of chair: Denies Difficulty using hands for taps, buttons, cutlery, and/or writing: Denies   Review of Systems  Constitutional: Positive for fatigue. Negative for night sweats,  weight gain and weight loss.  HENT: Negative for mouth sores, trouble swallowing, trouble swallowing, mouth dryness and nose dryness.   Eyes: Positive for dryness. Negative for pain, redness and visual disturbance.  Respiratory: Negative for cough, shortness of breath and difficulty breathing.   Cardiovascular: Positive for palpitations. Negative for chest pain, hypertension, irregular heartbeat and swelling in legs/feet.  Gastrointestinal: Negative for blood in stool, constipation and diarrhea.  Endocrine: Negative for increased urination.  Genitourinary: Negative for vaginal dryness.  Musculoskeletal: Positive for arthralgias and joint pain. Negative for joint swelling, myalgias, muscle weakness, morning stiffness, muscle tenderness and myalgias.  Skin: Positive for color change and hair loss. Negative for rash, skin tightness, ulcers and sensitivity to sunlight.  Allergic/Immunologic: Negative for susceptible to infections.  Neurological: Negative for dizziness, memory loss, night sweats and weakness.  Hematological: Negative for swollen glands.  Psychiatric/Behavioral: Positive for sleep disturbance. Negative for depressed mood. The patient is nervous/anxious.     PMFS History:  Patient Active Problem List   Diagnosis Date Noted  . POTS (postural orthostatic tachycardia syndrome) 08/16/2017  . Pain and swelling of left forearm 12/22/2016  . Dermatitis 09/04/2016  . Hair loss 09/04/2016  . Itchy scalp 09/04/2016  . Chest pain 09/12/2015  . Palpitations 05/02/2015  . Exercise-induced asthma 04/13/2013  . Raynaud's disease 04/13/2013    Past Medical History:  Diagnosis Date  .  Asthma   . Blood transfusion without reported diagnosis   . POTS (postural orthostatic tachycardia syndrome)   . Raynaud disease     Family History  Problem Relation Age of Onset  . Hypertension Father   . Hyperlipidemia Father   . Neuropathy Mother    Past Surgical History:  Procedure Laterality  Date  . WISDOM TOOTH EXTRACTION     as a teen    Social History   Social History Narrative  . Not on file     Objective: Vital Signs: BP 129/81 (BP Location: Right Arm, Patient Position: Sitting, Cuff Size: Normal)   Pulse 91   Resp 15   Ht '5\' 5"'$  (1.651 m)   Wt 116 lb (52.6 kg)   BMI 19.30 kg/m    Physical Exam  Constitutional: She is oriented to person, place, and time. She appears well-developed and well-nourished.  HENT:  Head: Normocephalic and atraumatic.  Eyes: Conjunctivae and EOM are normal.  Neck: Normal range of motion.  Cardiovascular: Normal rate, regular rhythm and intact distal pulses.  Murmur heard. Pulmonary/Chest: Effort normal and breath sounds normal.  Abdominal: Soft. Bowel sounds are normal.  Lymphadenopathy:    She has no cervical adenopathy.  Neurological: She is alert and oriented to person, place, and time.  Skin: Skin is warm and dry. Capillary refill takes 2 to 3 seconds.  Psychiatric: She has a normal mood and affect. Her behavior is normal.  Nursing note and vitals reviewed.    Musculoskeletal Exam: C-spine thoracic lumbar spine good range of motion.  Shoulder joints elbow joints wrist joint MCPs PIPs DIPs were in good range of motion with no synovitis.  Hip joints knee joints ankles MTPs PIPs but DIPs were in good range of motion with no synovitis.  CDAI Exam: No CDAI exam completed.    Investigation: No additional findings.  Component     Latest Ref Rng & Units 07/20/2017  Uric Acid     2.5 - 7.1 mg/dL 3.7  ASO     0.0 - 200.0 IU/mL 120.0  CRP     0.0 - 4.9 mg/L 2.1  RA Latex Turbid.     0.0 - 13.9 IU/mL <10.0  Anit Nuclear Antibody(ANA)     Negative Negative    CBC Latest Ref Rng & Units 07/20/2017 06/28/2017 03/10/2017  WBC 3.4 - 10.8 x10E3/uL 7.3 9.8 8.0  Hemoglobin 11.1 - 15.9 g/dL 13.5 14.4 13.8  Hematocrit 34.0 - 46.6 % 40.2 42.3 41.2  Platelets 150 - 379 x10E3/uL 262 346 328   CMP Latest Ref Rng & Units 07/20/2017  11/19/2016 07/31/2016  Glucose 65 - 99 mg/dL 65 83 79  BUN 6 - 20 mg/dL '18 20 11  '$ Creatinine 0.57 - 1.00 mg/dL 0.80 0.83 0.90  Sodium 134 - 144 mmol/L 140 139 140  Potassium 3.5 - 5.2 mmol/L 4.0 3.8 4.2  Chloride 96 - 106 mmol/L 103 104 100  CO2 20 - 29 mmol/L '24 28 22  '$ Calcium 8.7 - 10.2 mg/dL 9.9 9.6 10.0  Total Protein 6.0 - 8.5 g/dL 7.2 - 7.0  Total Bilirubin 0.0 - 1.2 mg/dL <0.2 - 0.5  Alkaline Phos 39 - 117 IU/L 47 - 48  AST 0 - 40 IU/L 13 - 15  ALT 0 - 32 IU/L 12 - 12    Imaging: Dg Ankle Complete Left  Result Date: 07/20/2017 CLINICAL DATA:  27 y/o F; 1 month prior fell. Pain to the calcaneus radiating to the posterior calf and  knee. EXAM: LEFT FOOT - COMPLETE 3+ VIEW; LEFT ANKLE COMPLETE - 3+ VIEW COMPARISON:  None. FINDINGS: Left foot: There is no evidence of fracture or dislocation. There is no evidence of arthropathy or other focal bone abnormality. Soft tissues are unremarkable. Left ankle: There is no evidence of fracture or dislocation. There is no evidence of arthropathy or other focal bone abnormality. Soft tissues are unremarkable. IMPRESSION: Negative. Electronically Signed   By: Kristine Garbe M.D.   On: 07/20/2017 16:54   Dg Knee Complete 4 Views Left  Result Date: 07/20/2017 CLINICAL DATA:  27 y/o F; 1 month prior fell. Pain to the calcaneus radiating to the posterior calf and knee. EXAM: LEFT KNEE - COMPLETE 4+ VIEW COMPARISON:  None. FINDINGS: No evidence of fracture, dislocation, or joint effusion. No evidence of arthropathy or other focal bone abnormality. Soft tissues are unremarkable. IMPRESSION: Negative. Electronically Signed   By: Kristine Garbe M.D.   On: 07/20/2017 16:55   Dg Foot Complete Left  Result Date: 07/20/2017 CLINICAL DATA:  27 y/o F; 1 month prior fell. Pain to the calcaneus radiating to the posterior calf and knee. EXAM: LEFT FOOT - COMPLETE 3+ VIEW; LEFT ANKLE COMPLETE - 3+ VIEW COMPARISON:  None. FINDINGS: Left foot: There is no  evidence of fracture or dislocation. There is no evidence of arthropathy or other focal bone abnormality. Soft tissues are unremarkable. Left ankle: There is no evidence of fracture or dislocation. There is no evidence of arthropathy or other focal bone abnormality. Soft tissues are unremarkable. IMPRESSION: Negative. Electronically Signed   By: Kristine Garbe M.D.   On: 07/20/2017 16:54    Speciality Comments: No specialty comments available.    Procedures:  No procedures performed Allergies: Latex and Guaifenesin er   Assessment / Plan:     Visit Diagnoses: Polyarthralgia: Patient gives history of migratory polyarthralgia and also intermittent swelling in multiple joints.  She recalls having swelling in her right sternoclavicular joint her right elbow joint her right second MCP joint.  She has also had discomfort in her [facility] joint.  She does have hypermobility in her joints.  I do not see any synovitis on examination today.  Raynaud's syndrome without gangrene -patient gives history of severe rainouts phenomenon.  She has mild decreased capillary refill.  I will obtain following labs to evaluate this further.  Plan: Sedimentation rate, CK, Anti-Smith antibody, RNP Antibody, Anti-scleroderma antibody, Beta-2 glycoprotein antibodies, Cardiolipin antibodies, IgG, IgM, IgA, Lupus Anticoagulant Eval w/Reflex, Pan-ANCA, Cryoglobulin, Angiotensin converting enzyme, HLA-B27 antigen  Pain and swelling of left forearm -patient reports having swelling of her left forearm at the time the x-rays and scans were negative per patient.  ANA -, RF-, uric acid 3.7, CRP WNL  Pain in both hands -no synovitis was noted on examination today.  Plan: Cyclic citrul peptide antibody, IgG, 14-3-3 eta Protein, Anti-DNA antibody, double-stranded, C3 and C4, Urinalysis, Routine w reflex microscopic  Other fatigue - Plan: Serum protein electrophoresis with reflex, Hepatitis B core antibody, IgM, Hepatitis B surface  antigen, Hepatitis C antibody  Dermatitis: I do not see any active disease today.  Patient states that she has dry scalp and some redness intermittently.  She had dermatology workup in the past which was negative.  Hair loss: No hair thinning was noted.  POTS (postural orthostatic tachycardia syndrome) - Diagnosed by cardiologist.    Orders: Orders Placed This Encounter  Procedures  . Sedimentation rate  . Cyclic citrul peptide antibody, IgG  . 14-3-3 eta  Protein  . Anti-DNA antibody, double-stranded  . C3 and C4  . Urinalysis, Routine w reflex microscopic  . CK  . Anti-Smith antibody  . RNP Antibody  . Anti-scleroderma antibody  . Beta-2 glycoprotein antibodies  . Cardiolipin antibodies, IgG, IgM, IgA  . Lupus Anticoagulant Eval w/Reflex  . Pan-ANCA  . Cryoglobulin  . Serum protein electrophoresis with reflex  . Hepatitis B core antibody, IgM  . Hepatitis B surface antigen  . Hepatitis C antibody  . Angiotensin converting enzyme  . HLA-B27 antigen   No orders of the defined types were placed in this encounter.   Face-to-face time spent with patient was 50 minutes.  Greater than 50% of time was spent in counseling and coordination of care.  Follow-Up Instructions: Return for Raynauds phenomenon.   Bo Merino, MD  Note - This record has been created using Editor, commissioning.  Chart creation errors have been sought, but may not always  have been located. Such creation errors do not reflect on  the standard of medical care.

## 2017-08-10 NOTE — Addendum Note (Signed)
Addended by: Sherren MochaSHAW, Rodneisha Bonnet N on: 08/10/2017 04:34 PM   Modules accepted: Orders

## 2017-08-16 ENCOUNTER — Encounter: Payer: Self-pay | Admitting: Rheumatology

## 2017-08-16 ENCOUNTER — Ambulatory Visit (INDEPENDENT_AMBULATORY_CARE_PROVIDER_SITE_OTHER): Payer: BC Managed Care – PPO | Admitting: Rheumatology

## 2017-08-16 VITALS — BP 129/81 | HR 91 | Resp 15 | Ht 65.0 in | Wt 116.0 lb

## 2017-08-16 DIAGNOSIS — I951 Orthostatic hypotension: Secondary | ICD-10-CM

## 2017-08-16 DIAGNOSIS — L309 Dermatitis, unspecified: Secondary | ICD-10-CM | POA: Diagnosis not present

## 2017-08-16 DIAGNOSIS — L659 Nonscarring hair loss, unspecified: Secondary | ICD-10-CM

## 2017-08-16 DIAGNOSIS — I7300 Raynaud's syndrome without gangrene: Secondary | ICD-10-CM

## 2017-08-16 DIAGNOSIS — R002 Palpitations: Secondary | ICD-10-CM

## 2017-08-16 DIAGNOSIS — M2550 Pain in unspecified joint: Secondary | ICD-10-CM

## 2017-08-16 DIAGNOSIS — R5383 Other fatigue: Secondary | ICD-10-CM

## 2017-08-16 DIAGNOSIS — M79632 Pain in left forearm: Secondary | ICD-10-CM

## 2017-08-16 DIAGNOSIS — R000 Tachycardia, unspecified: Secondary | ICD-10-CM | POA: Diagnosis not present

## 2017-08-16 DIAGNOSIS — M7989 Other specified soft tissue disorders: Secondary | ICD-10-CM

## 2017-08-16 DIAGNOSIS — G90A Postural orthostatic tachycardia syndrome (POTS): Secondary | ICD-10-CM

## 2017-08-16 DIAGNOSIS — M79641 Pain in right hand: Secondary | ICD-10-CM

## 2017-08-16 DIAGNOSIS — M79642 Pain in left hand: Secondary | ICD-10-CM | POA: Diagnosis not present

## 2017-08-21 LAB — CARDIOLIPIN ANTIBODIES, IGG, IGM, IGA: Anticardiolipin IgA: <11 [APL'U]

## 2017-08-21 LAB — URINALYSIS, ROUTINE W REFLEX MICROSCOPIC
Bilirubin Urine: NEGATIVE
GLUCOSE, UA: NEGATIVE
HGB URINE DIPSTICK: NEGATIVE
Ketones, ur: NEGATIVE
Leukocytes, UA: NEGATIVE
NITRITE: NEGATIVE
PH: 6 (ref 5.0–8.0)
Protein, ur: NEGATIVE
SPECIFIC GRAVITY, URINE: 1.018 (ref 1.001–1.03)

## 2017-08-21 LAB — BETA-2 GLYCOPROTEIN ANTIBODIES
Beta-2 Glyco 1 IgA: <9 SAU (ref <=20)
Beta-2 Glyco I IgG: <9 SGU (ref <=20)

## 2017-08-21 LAB — PROTEIN ELECTROPHORESIS, SERUM, WITH REFLEX
ALBUMIN ELP: 4.5 g/dL (ref 3.8–4.8)
ALPHA 1: 0.4 g/dL — AB (ref 0.2–0.3)
Alpha 2: 0.9 g/dL (ref 0.5–0.9)
BETA 2: 0.2 g/dL (ref 0.2–0.5)
Beta Globulin: 0.4 g/dL (ref 0.4–0.6)
GAMMA GLOBULIN: 0.8 g/dL (ref 0.8–1.7)
TOTAL PROTEIN: 7.3 g/dL (ref 6.1–8.1)

## 2017-08-21 LAB — LUPUS ANTICOAGULANT EVAL W/ REFLEX
PTT LA SCREEN: 32 s (ref <=40)
dRVVT Screen: 40 s (ref <=45)

## 2017-08-21 LAB — 14-3-3 ETA PROTEIN

## 2017-08-21 LAB — PAN-ANCA
ANCA Screen: NEGATIVE
Myeloperoxidase Abs: <1 AI
Serine Protease 3: <1 AI

## 2017-08-21 LAB — CRYOGLOBULIN: Cryoglobulin, Qualitative Analysis: NOT DETECTED

## 2017-08-21 LAB — IFE INTERPRETATION: Immunofix Electr Int: NOT DETECTED

## 2017-08-21 LAB — ANTI-SCLERODERMA ANTIBODY: SCLERODERMA (SCL-70) (ENA) ANTIBODY, IGG: NEGATIVE AI

## 2017-08-21 LAB — ANTI-DNA ANTIBODY, DOUBLE-STRANDED: ds DNA Ab: 3 IU/mL

## 2017-08-21 LAB — HEPATITIS C ANTIBODY
Hepatitis C Ab: NONREACTIVE
SIGNAL TO CUT-OFF: 0 (ref <1.00)

## 2017-08-21 LAB — C3 AND C4
C3 COMPLEMENT: 116 mg/dL (ref 83–193)
C4 Complement: 20 mg/dL (ref 15–57)

## 2017-08-21 LAB — CK: Total CK: 69 U/L (ref 29–143)

## 2017-08-21 LAB — ANTI-SMITH ANTIBODY: ENA SM AB SER-ACNC: NEGATIVE AI

## 2017-08-21 LAB — HEPATITIS B CORE ANTIBODY, IGM: Hep B C IgM: NONREACTIVE

## 2017-08-21 LAB — ANGIOTENSIN CONVERTING ENZYME: ANGIOTENSIN-CONVERTING ENZYME: 35 U/L (ref 9–67)

## 2017-08-21 LAB — RNP ANTIBODY: RIBONUCLEIC PROTEIN(ENA) ANTIBODY, IGG: NEGATIVE AI

## 2017-08-21 LAB — SEDIMENTATION RATE: Sed Rate: 6 mm/h (ref 0–20)

## 2017-08-21 LAB — CYCLIC CITRUL PEPTIDE ANTIBODY, IGG: Cyclic Citrullin Peptide Ab: <16 UNITS

## 2017-08-21 LAB — HEPATITIS B SURFACE ANTIGEN: Hepatitis B Surface Ag: NONREACTIVE

## 2017-08-21 LAB — HLA-B27 ANTIGEN: HLA-B27 ANTIGEN: NEGATIVE

## 2017-08-22 NOTE — Progress Notes (Signed)
WMLs

## 2017-08-30 ENCOUNTER — Telehealth: Payer: Self-pay | Admitting: *Deleted

## 2017-08-30 NOTE — Telephone Encounter (Signed)
-----   Message from Audrie LiaSharon H Caudle, RT sent at 08/29/2017  5:40 PM EDT ----- Regarding: FW: [facility] does not have patient insurance  Can you please check on this? Thank you.  ----- Message ----- From: Consuello ClossJames, Marion K Sent: 08/29/2017   4:23 PM To: Audrie LiaSharon H Caudle, RT Subject: [facility] does not have patient insurance          Hi, sharon   Patient called got a bill from [facility] where we sent her bloodwork and it seems it was not filed with her [employer]. Could we make sure that her insurance was forward. [facility]   Thank  You,   Joyce Grosskay

## 2017-08-30 NOTE — Telephone Encounter (Signed)
[facility] called [employer] and provided then with correct insurance information. Insurance information on file with [employer] was incorrect. Patient to disregard bill and allow 30 day to re-file. Patient advised.

## 2017-09-13 NOTE — Progress Notes (Signed)
Office Visit Note  Patient: Amy Navarro             Date of Birth: 1991/04/16           MRN: [id number]             PCP: Hillis Range Referring: Tereasa Coop, PA-C Visit Date: 09/21/2017 Occupation: _0 @    Subjective:  Pain in hands and feet.   History of Present Illness: Amy Navarro is a 27 y.o. female with history of polyarthralgia.  According to her she has episodic joint pain.  Her last flare was in her right [facility] joint.  She states when she has increased pain she also notices swelling.  She states some of her joints are permanently swelling which include her left ankle and left arm.  Her Raynauds is off and on it does not get affected by the temperature much.  She has not had rash in her scalp recently.  She has been taking amlodipine for Raynaud's.  Activities of Daily Living:  Patient reports morning stiffness for 0 minutes.   Patient Reports nocturnal pain.  Difficulty dressing/grooming: Reports Difficulty climbing stairs: Reports Difficulty getting out of chair: Reports Difficulty using hands for taps, buttons, cutlery, and/or writing: Reports   Review of Systems  Constitutional: Positive for fatigue. Negative for night sweats, weight gain and weight loss.  HENT: Negative for mouth sores, trouble swallowing, trouble swallowing, mouth dryness and nose dryness.   Eyes: Positive for dryness. Negative for pain, redness and visual disturbance.  Respiratory: Negative for cough, shortness of breath and difficulty breathing.   Cardiovascular: Positive for swelling in legs/feet. Negative for chest pain, palpitations, hypertension and irregular heartbeat.  Gastrointestinal: Negative for abdominal pain, blood in stool, constipation and diarrhea.  Endocrine: Negative for increased urination.  Genitourinary: Negative for pelvic pain and vaginal dryness.  Musculoskeletal: Positive for arthralgias, joint pain and joint swelling. Negative for myalgias, muscle  weakness, morning stiffness, muscle tenderness and myalgias.  Skin: Negative for color change, rash, hair loss, skin tightness, ulcers and sensitivity to sunlight.  Allergic/Immunologic: Negative for susceptible to infections.  Neurological: Negative for dizziness, headaches, memory loss, night sweats and weakness.  Hematological: Negative for bruising/bleeding tendency and swollen glands.  Psychiatric/Behavioral: Negative for depressed mood, confusion and sleep disturbance. The patient is not nervous/anxious.     PMFS History:  Patient Active Problem List   Diagnosis Date Noted  . POTS (postural orthostatic tachycardia syndrome) 08/16/2017  . Pain and swelling of left forearm 12/22/2016  . Dermatitis 09/04/2016  . Hair loss 09/04/2016  . Itchy scalp 09/04/2016  . Chest pain 09/12/2015  . Palpitations 05/02/2015  . Exercise-induced asthma 04/13/2013  . Raynaud's disease 04/13/2013    Past Medical History:  Diagnosis Date  . Asthma   . Blood transfusion without reported diagnosis   . POTS (postural orthostatic tachycardia syndrome)   . Raynaud disease     Family History  Problem Relation Age of Onset  . Hypertension Father   . Hyperlipidemia Father   . Neuropathy Mother    Past Surgical History:  Procedure Laterality Date  . WISDOM TOOTH EXTRACTION     as a teen    Social History   Social History Narrative  . Not on file     Objective: Vital Signs: BP 127/78 (BP Location: Right Arm, Patient Position: Sitting, Cuff Size: Normal)   Pulse 92   Resp 12   Ht _1  (1.651 m)   Wt 116 lb (  52.6 kg)   BMI 19.30 kg/m    Physical Exam  Constitutional: She is oriented to person, place, and time. She appears well-developed and well-nourished.  HENT:  Head: Normocephalic and atraumatic.  Eyes: Conjunctivae and EOM are normal.  Neck: Normal range of motion.  Cardiovascular: Normal rate, regular rhythm, normal heart sounds and intact distal pulses.  Pulmonary/Chest:  Effort normal and breath sounds normal.  Abdominal: Soft. Bowel sounds are normal.  Lymphadenopathy:    She has no cervical adenopathy.  Neurological: She is alert and oriented to person, place, and time.  Skin: Skin is warm and dry. Capillary refill takes less than 2 seconds.  Psychiatric: She has a normal mood and affect. Her behavior is normal.  Nursing note and vitals reviewed.    Musculoskeletal Exam: C-spine thoracic lumbar spine good range of motion.  Shoulder joints all the joints wrist joint MCPs PIPs DIPs been good range of motion.  She has mild tenderness over right CMC joint.  Hip joints knee joints ankles MTPs PIPs were in good range of motion.  CDAI Exam: No CDAI exam completed.    Investigation: No additional findings. August 16, 2017 UA negative, IFE negative for monoclonal protein, CK normal Lupus anticoagulant negative, beta-2 negative, anticardiolipin negative, ENA negative, C3-C4 normal, ESR 6, pan ANCA negative, anti-CCP negative, _0 eta negative, cryoglobulins negative  Imaging: No results found.  Speciality Comments: No specialty comments available.    Procedures:  No procedures performed Allergies: Latex and Guaifenesin er   Assessment / Plan:     Visit Diagnoses: Polyarthralgia - All autoimmune work-up negative.  Patient had no synovitis on examination.  She gives history of intermittent swelling.  I do not see any swelling on examination today.  Her lab work was discussed at length.  All her labs are within normal limits.  She does have hypermobility in her joints which could be contributing to polyarthralgia.  Isometric exercises were discussed.  She has some right CMC discomfort.  Use of CMC brace was discussed.  Raynaud's disease without gangrene - All autoimmune work-up negative.  Keeping core body temperature warm was discussed.  She has been taking amlodipine which is been helpful.  Dermatitis - Her scalp per patient.  Work-up by dermatologist  negative per patient.  She has no rash currently.  POTS (postural orthostatic tachycardia syndrome) - Diagnosed by her cardiologist.    Orders: No orders of the defined types were placed in this encounter.  No orders of the defined types were placed in this encounter.   Face-to-face time spent with patient was 25 minutes. >50% of time was spent in counseling and coordination of care.  Follow-Up Instructions: Return if symptoms worsen or fail to improve.   Bo Merino, MD  Note - This record has been created using Editor, commissioning.  Chart creation errors have been sought, but may not always  have been located. Such creation errors do not reflect on  the standard of medical care.

## 2017-09-21 ENCOUNTER — Ambulatory Visit: Payer: BC Managed Care – PPO | Admitting: Rheumatology

## 2017-09-21 ENCOUNTER — Encounter: Payer: Self-pay | Admitting: Rheumatology

## 2017-09-21 VITALS — BP 127/78 | HR 92 | Resp 12 | Ht 65.0 in | Wt 116.0 lb

## 2017-09-21 DIAGNOSIS — I7300 Raynaud's syndrome without gangrene: Secondary | ICD-10-CM

## 2017-09-21 DIAGNOSIS — L309 Dermatitis, unspecified: Secondary | ICD-10-CM | POA: Diagnosis not present

## 2017-09-21 DIAGNOSIS — M2550 Pain in unspecified joint: Secondary | ICD-10-CM | POA: Diagnosis not present

## 2017-09-21 DIAGNOSIS — G90A Postural orthostatic tachycardia syndrome (POTS): Secondary | ICD-10-CM

## 2017-09-21 DIAGNOSIS — I951 Orthostatic hypotension: Secondary | ICD-10-CM | POA: Diagnosis not present

## 2017-09-21 DIAGNOSIS — R000 Tachycardia, unspecified: Secondary | ICD-10-CM

## 2017-09-22 NOTE — Progress Notes (Signed)
Thank you for seeing her.  I wish we could find a meaningful dx for patient.  She is an identical twin and her sister (positive for LA antibody) has similar symptoms.  Her mother is also afflicted with several unexplained symptoms.  I am sending the mother to a genetic counselor to see if something can be uncovered there.Deliah Boston, MS, P with similar work up. A-C 9:08 AM, 09/22/2017

## 2017-09-23 ENCOUNTER — Other Ambulatory Visit: Payer: Self-pay | Admitting: Physician Assistant

## 2017-09-23 MED ORDER — CYCLOBENZAPRINE HCL 10 MG PO TABS: 5.0000 mg | ORAL_TABLET | Freq: Three times a day (TID) | ORAL | 3 refills | Status: DC | PRN

## 2017-09-23 NOTE — Progress Notes (Unsigned)
    09/23/2017 3:52 PM   DOB: 1990-07-11 / MRN: [id number]  SUBJECTIVE:  Amy Navarro is a 27 y.o. female presenting for   She is allergic to latex and guaifenesin er.   She  has a past medical history of Asthma, Blood transfusion without reported diagnosis, POTS (postural orthostatic tachycardia syndrome), and Raynaud disease.    She  reports that she has never smoked. She has never used smokeless tobacco. She reports that she drinks alcohol. She reports that she does not use drugs. She  reports that she currently engages in sexual activity. She reports using the following methods of birth control/protection: None and Pill. The patient  has a past surgical history that includes Wisdom tooth extraction.  Her family history includes Hyperlipidemia in her father; Hypertension in her father; Neuropathy in her mother.  ROS  The problem list and medications were reviewed and updated by myself where necessary and exist elsewhere in the encounter.   OBJECTIVE:  There were no vitals taken for this visit.  Lab Results  Component Value Date   CREATININE 0.80 07/20/2017   BUN 18 07/20/2017   NA 140 07/20/2017   K 4.0 07/20/2017   CL 103 07/20/2017   CO2 24 07/20/2017     Physical Exam  No results found for this or any previous visit (from the past 72 hour(s)).  No results found.  ASSESSMENT AND PLAN:  There are no diagnoses linked to this encounter.  The patient is advised to call or return to clinic if she does not see an improvement in symptoms, or to seek the care of the closest emergency department if she worsens with the above plan.   Deliah Boston, MHS, PA-C Primary Care at [facility] 09/23/2017 3:52 PM

## 2017-10-05 ENCOUNTER — Encounter

## 2017-10-05 ENCOUNTER — Ambulatory Visit: Payer: BC Managed Care – PPO | Admitting: Neurology

## 2017-10-12 ENCOUNTER — Other Ambulatory Visit: Payer: Self-pay | Admitting: Physician Assistant

## 2017-10-12 DIAGNOSIS — I7300 Raynaud's syndrome without gangrene: Secondary | ICD-10-CM

## 2017-10-12 NOTE — Telephone Encounter (Signed)
Copied from CRM [id number]. Topic: Quick Communication - See Telephone Encounter >> Oct 12, 2017  3:20 PM Windy Kalata, NT wrote: CRM for notification. See Telephone encounter for: 10/12/17.  Patient is needing a refill on amLODipine (NORVASC) 5 MG tablet. She states that she was prescribed this at [facility] Please advise.   [id number] - [place], [place] - [address] AT [facility] OF [address] [place] [address] Phone: [phone] Fax: [phone]

## 2017-10-13 NOTE — Telephone Encounter (Signed)
LOV  07/20/17 Amy Navarro Last refill 07/05/17  #90 with 0 refill

## 2017-10-14 ENCOUNTER — Encounter: Payer: Self-pay | Admitting: Physician Assistant

## 2017-10-17 ENCOUNTER — Other Ambulatory Visit: Payer: Self-pay | Admitting: Physician Assistant

## 2017-10-17 ENCOUNTER — Other Ambulatory Visit: Payer: Self-pay

## 2017-10-17 DIAGNOSIS — I7300 Raynaud's syndrome without gangrene: Secondary | ICD-10-CM

## 2017-10-17 MED ORDER — AMLODIPINE BESYLATE 5 MG PO TABS: 5.0000 mg | ORAL_TABLET | Freq: Every day | ORAL | 0 refills | Status: DC

## 2017-10-21 ENCOUNTER — Other Ambulatory Visit: Payer: Self-pay

## 2017-10-21 ENCOUNTER — Encounter: Payer: Self-pay | Admitting: Physician Assistant

## 2017-10-21 ENCOUNTER — Ambulatory Visit: Payer: BC Managed Care – PPO | Admitting: Physician Assistant

## 2017-10-21 VITALS — BP 114/68 | HR 87 | Temp 98.3°F | Resp 16 | Ht 65.0 in | Wt 115.0 lb

## 2017-10-21 DIAGNOSIS — L02419 Cutaneous abscess of limb, unspecified: Secondary | ICD-10-CM | POA: Diagnosis not present

## 2017-10-21 DIAGNOSIS — R5383 Other fatigue: Secondary | ICD-10-CM

## 2017-10-21 DIAGNOSIS — I7300 Raynaud's syndrome without gangrene: Secondary | ICD-10-CM | POA: Diagnosis not present

## 2017-10-21 MED ORDER — TRAMADOL HCL 50 MG PO TABS: 50.0000 mg | ORAL_TABLET | Freq: Three times a day (TID) | ORAL | 1 refills | Status: DC | PRN

## 2017-10-21 MED ORDER — CYANOCOBALAMIN 1000 MCG/ML IJ SOLN
1000.0000 ug | INTRAMUSCULAR | Status: AC
  Administered 2017-10-21: 1000 ug via INTRAMUSCULAR

## 2017-10-21 MED ORDER — AMLODIPINE BESYLATE 5 MG PO TABS: 2.5000 mg | ORAL_TABLET | Freq: Every day | ORAL | 3 refills | Status: DC

## 2017-10-21 MED ORDER — DOXYCYCLINE HYCLATE 100 MG PO CAPS: 100.0000 mg | ORAL_CAPSULE | Freq: Two times a day (BID) | ORAL | 0 refills | Status: AC

## 2017-10-21 MED ORDER — NORTREL 1/35 (28) 1-35 MG-MCG PO TABS: ORAL_TABLET | ORAL | 4 refills | Status: DC

## 2017-10-21 NOTE — Patient Instructions (Signed)
     IF you received an x-ray today, you will receive an invoice from [facility]. Please contact [facility] at [phone] with questions or concerns regarding your invoice.   IF you received labwork today, you will receive an invoice from [facility]. Please contact [facility] at [phone] with questions or concerns regarding your invoice.   Our billing staff will not be able to assist you with questions regarding bills from these companies.  You will be contacted with the lab results as soon as they are available. The fastest way to get your results is to activate your My Chart account. Instructions are located on the last page of this paperwork. If you have not heard from us regarding the results in 2 weeks, please contact this office.     

## 2017-10-21 NOTE — Progress Notes (Signed)
10/21/2017 10:44 AM   DOB: 02/24/1991 / MRN: [id number]  SUBJECTIVE:  Amy Navarro is a 27 y.o. female presenting for refills of Norvasc.  She tells me that it helps with the pain and swelling she will often experience with Raynauds.  Her sister, an identical twin does have a positive La  anti-body.  Patient has been evaluated by rheumatology and no specific diagnosis could be made.  Patient is requesting neurological evaluation.  She has a history of pots.  She feels well today aside from a sore about her right axilla.  She received a B12 injection from Dr. Clelia CroftShaw the last time she was here and reports is helped with fatigue, however the patient has no history of B12 deficiency per labs.  She is allergic to latex and guaifenesin er.   She  has a past medical history of ADHD, Asthma, Blood transfusion without reported diagnosis, POTS (postural orthostatic tachycardia syndrome), and Raynaud disease.    She  reports that she has never smoked. She has never used smokeless tobacco. She reports that she drinks alcohol. She reports that she does not use drugs. She  reports that she currently engages in sexual activity. She reports using the following methods of birth control/protection: None and Pill. The patient  has a past surgical history that includes Wisdom tooth extraction.  Her family history includes Hyperlipidemia in her father; Hypertension in her father; Neuropathy in her mother.  Review of Systems  Constitutional: Negative for chills, diaphoresis and fever.  Eyes: Negative.   Respiratory: Negative for cough, hemoptysis, sputum production, shortness of breath and wheezing.   Cardiovascular: Negative for chest pain, orthopnea and leg swelling.  Gastrointestinal: Negative for abdominal pain, blood in stool, constipation, diarrhea, heartburn, melena, nausea and vomiting.  Genitourinary: Negative for dysuria, flank pain, frequency, hematuria and urgency.  Skin: Negative for rash.    Neurological: Negative for dizziness, sensory change, speech change, focal weakness and headaches.    The problem list and medications were reviewed and updated by myself where necessary and exist elsewhere in the encounter.   OBJECTIVE:  BP 114/68   Pulse 87   Temp 98.3 F (36.8 C) (Oral)   Resp 16   Ht 5\' 5"  (1.651 m)   Wt 115 lb (52.2 kg)   SpO2 99%   BMI 19.14 kg/m   Physical Exam  Constitutional: She is oriented to person, place, and time. She appears well-nourished. No distress.  Eyes: Pupils are equal, round, and reactive to light. EOM are normal.  Cardiovascular: Normal rate.  Pulmonary/Chest: Effort normal.  Abdominal: She exhibits no distension.  Neurological: She is alert and oriented to person, place, and time. No cranial nerve deficit. Gait normal.  Skin: Skin is dry. She is not diaphoretic.     Psychiatric: She has a normal mood and affect.  Vitals reviewed.   No results found for this or any previous visit (from the past 72 hour(s)).  No results found.  ASSESSMENT AND PLAN:  Amy Navarro was seen today for medication refill, b12 injection and referral.  Diagnoses and all orders for this visit:  Fatigue, unspecified type: Patient continues to have periodic swelling which does improve with Norvasc.  She has some mild swelling about her left forearm today however this time.  She seen rheumatology and had a negative work-up there.  She has a history of pots.  Her sister also has a strange conglomeration of symptoms without other unifying diagnosis.  Patient requesting to be seen by  neurology and I think this is a good idea.  I have referred her to Dr. Daisy Blossom at the [facility] and have messaged Dr. Daisy Blossom.  I appreciate any recommendations that she may have regarding patient's long history of unexplained symptoms. -     cyanocobalamin ((VITAMIN B-12)) injection 1,000 mcg  Raynaud's phenomenon without gangrene -     Discontinue: amLODipine (NORVASC) 5 MG tablet; Take  0.5-1 tablets (2.5-5 mg total) by mouth daily. -     amLODipine (NORVASC) 5 MG tablet; Take 0.5-1 tablets (2.5-5 mg total) by mouth daily. -     traMADol (ULTRAM) 50 MG tablet; Take 1 tablet (50 mg total) by mouth every 8 (eight) hours as needed for severe pain. -     Ambulatory referral to Neurology -     NORTREL 1/35, 28, tablet; TK 1 T PO D  Axillary abscess -     doxycycline (VIBRAMYCIN) 100 MG capsule; Take 1 capsule (100 mg total) by mouth 2 (two) times daily for 10 days. Use sunscreen in out in the sun.    The patient is advised to call or return to clinic if she does not see an improvement in symptoms, or to seek the care of the closest emergency department if she worsens with the above plan.   Deliah Boston, MHS, PA-C Primary Care at [facility] 10/21/2017 10:44 AM

## 2018-01-02 ENCOUNTER — Ambulatory Visit: Payer: BC Managed Care – PPO | Admitting: Neurology

## 2018-01-02 ENCOUNTER — Encounter: Payer: Self-pay | Admitting: Neurology

## 2018-01-02 ENCOUNTER — Encounter

## 2018-01-02 VITALS — BP 127/79 | HR 88 | Ht 65.0 in | Wt 120.0 lb

## 2018-01-02 DIAGNOSIS — R202 Paresthesia of skin: Secondary | ICD-10-CM | POA: Diagnosis not present

## 2018-01-02 DIAGNOSIS — I7300 Raynaud's syndrome without gangrene: Secondary | ICD-10-CM

## 2018-01-02 NOTE — Progress Notes (Signed)
[facility]    Provider:  Dr Jaynee Eagles Referring Provider: Tereasa Coop, PA-C Primary Care Physician:  Patient, No Pcp Per  CC: Swollen left forearm and left ankle behind knee.  Feet burn and sometimes go numb when having Raynaud's symptoms.  HPI:  Amy Navarro is a 27 y.o. female here as requested by Dr. Carlis Abbott for Raynaud's phenomenon. PMHx asthma, anxiety and POTs. No hx of b12 deficiency but has had injectons. She has already been to Rheumatology and requesting neurology. She gets swelling in her left forearm and in her ankle into the achilles, she gets tingling in her right first 3 digits in her right hand occasionally none today. Swelling behind her knee has happened once. No symptoms today however. She has Raynaud's and her feet burn and get red and the skin looks different and white when having the symptoms. No other focal neurologic deficits, associated symptoms, inciting events or modifiable factors.  Reviewed notes, labs and imaging from outside physicians, which showed:  Reviewed Mariea Clonts notes.  Patient has a history of Raynaud's and has been evaluated by rheumatology her identical twin does have a positive LA antibody.  At rheumatology no specific diagnosis could be made.  Patient requested to be seen here in neurology.  She does have a history of p.o. TS.  She received a B12 injection from Dr. Brigitte Pulse however the patient has no history of B12 deficiency per labs but it did help with fatigue.  She is never smoked.  She drinks alcohol does not use drugs.  Her mother has neuropathy.  She has chronic fatigue and periodic swelling in her left forearm had a negative rheumatology work-up.  Her sister also has a "strange conglomeration of symptoms without other unifying diagnosis".  Patient requested to see neurology.  She has a long history of unexplained symptoms.  Extensive rheumatologic workup unremarkable HLA-B27, angiotensin-converting enzyme, hepatitis B  hepatitis C, sed rate, CCP, SPEP, IFE, cryoglobulin, pan ANCA, lupus anticoagulant, cardiolipin antibodies, beta-2 glycoprotein, RNP antibody, anti-Smith antibody, CK, C3 and C4, anti-DNA antibody, thyroid panel, CMP, CBC, B12, vitamin D, Epstein-Barr virus, HIV, Lyme  Review of Systems: Patient complains of symptoms per HPI as well as the following symptoms: Joint pain, joint swelling, aching muscles, numbness, weakness, dizziness, anxiety. Pertinent negatives and positives per HPI. All others negative.   Social History   Socioeconomic History  . Marital status: Single    Spouse name: Not on file  . Number of children: Not on file  . Years of education: Not on file  . Highest education level: Master's degree (e.g., MA, MS, MEng, MEd, MSW, MBA)  Occupational History  . Not on file  Social Needs  . Financial resource strain: Not on file  . Food insecurity:    Worry: Not on file    Inability: Not on file  . Transportation needs:    Medical: Not on file    Non-medical: Not on file  Tobacco Use  . Smoking status: Never Smoker  . Smokeless tobacco: Never Used  Substance and Sexual Activity  . Alcohol use: Yes    Comment: occ  . Drug use: Never  . Sexual activity: Yes    Birth control/protection: None, Pill  Lifestyle  . Physical activity:    Days per week: Not on file    Minutes per session: Not on file  . Stress: Not on file  Relationships  . Social connections:    Talks on phone: Not on file  Gets together: Not on file    Attends religious service: Not on file    Active member of club or organization: Not on file    Attends meetings of clubs or organizations: Not on file    Relationship status: Not on file  . Intimate partner violence:    Fear of current or ex partner: Not on file    Emotionally abused: Not on file    Physically abused: Not on file    Forced sexual activity: Not on file  Other Topics Concern  . Not on file  Social History Narrative   Lives at home  with her mother- about to move    Right handed   Caffeine: 1 cup daily    Family History  Problem Relation Age of Onset  . Hypertension Father   . Hyperlipidemia Father   . Neuropathy Mother        peripheral  . Raynaud syndrome Mother   . Raynaud syndrome Sister   . Neuropathy Maternal Grandmother        peripheral  . Raynaud syndrome Maternal Grandmother   . Alzheimer's disease Maternal Grandfather   . ALS Maternal Uncle     Past Medical History:  Diagnosis Date  . ADHD   . Anxiety   . Asthma   . Blood transfusion without reported diagnosis   . POTS (postural orthostatic tachycardia syndrome)   . Raynaud disease     Past Surgical History:  Procedure Laterality Date  . Liberal EXTRACTION  2010   as a teen     Current Outpatient Medications  Medication Sig Dispense Refill  . albuterol (PROVENTIL) (5 MG/ML) 0.5% nebulizer solution Take 2.5 mg by nebulization every 6 (six) hours as needed for wheezing or shortness of breath.    Marland Kitchen amLODipine (NORVASC) 5 MG tablet Take 0.5-1 tablets (2.5-5 mg total) by mouth daily. 90 tablet 3  . amphetamine-dextroamphetamine (ADDERALL) 20 MG tablet Take 20 mg by mouth daily.    . Cetirizine HCl (WAL-ZYR PO) Take 1 tablet by mouth daily.    . Cholecalciferol (VITAMIN D) 2000 units tablet Take 2,000 Units by mouth daily.    Marland Kitchen lamoTRIgine (LAMICTAL) 25 MG tablet Take 1 tablet by mouth daily  1  . NORTREL 1/35, 28, tablet TK 1 T PO D 1 Package 4  . ALPRAZolam (XANAX) 0.5 MG tablet Take 1 tablet (0.5 mg total) by mouth at bedtime as needed for anxiety. (Patient not taking: Reported on 01/02/2018) 20 tablet 0  . cyclobenzaprine (FLEXERIL) 10 MG tablet Take 0.5-1 tablets (5-10 mg total) by mouth 3 (three) times daily as needed. (Patient not taking: Reported on 01/02/2018) 30 tablet 3  . traMADol (ULTRAM) 50 MG tablet Take 1 tablet (50 mg total) by mouth every 8 (eight) hours as needed for severe pain. (Patient not taking: Reported on  01/02/2018) 60 tablet 1   Current Facility-Administered Medications  Medication Dose Route Frequency Provider Last Rate Last Dose  . cyanocobalamin ((VITAMIN B-12)) injection 1,000 mcg  1,000 mcg Intramuscular Q30 days Philis Fendt L, PA-C   1,000 mcg at 10/21/17 1029  . methylPREDNISolone acetate (DEPO-MEDROL) injection 120 mg  120 mg Intramuscular Once Shawnee Knapp, MD        Allergies as of 01/02/2018 - Review Complete 01/02/2018  Allergen Reaction Noted  . Latex Hives, Shortness Of Breath, and Rash 04/11/2013  . Guaifenesin er Nausea And Vomiting 07/27/2011    Vitals: BP 127/79 (BP Location: Right Arm, Patient Position: Sitting)  Pulse 88   Ht '5\' 5"'$  (1.651 m)   Wt 120 lb (54.4 kg)   BMI 19.97 kg/m  Last Weight:  Wt Readings from Last 1 Encounters:  01/02/18 120 lb (54.4 kg)   Last Height:   Ht Readings from Last 1 Encounters:  01/02/18 '5\' 5"'$  (1.651 m)    Physical exam: Exam: Gen: NAD                 CV: RRR, no MRG. No Carotid Bruits. No peripheral edema, warm, nontender Eyes: Conjunctivae clear without exudates or hemorrhage  Neuro: Detailed Neurologic Exam  Speech:    Speech is normal; fluent and spontaneous with normal comprehension.  Cognition:    The patient is oriented to person, place, and time;     recent and remote memory intact;     language fluent;     normal attention, concentration,     fund of knowledge Cranial Nerves:    The pupils are equal, round, and reactive to light. The fundi are normal and spontaneous venous pulsations are present. Visual fields are full to finger confrontation. Extraocular movements are intact. Trigeminal sensation is intact and the muscles of mastication are normal. The face is symmetric. The palate elevates in the midline. Hearing intact. Voice is normal. Shoulder shrug is normal. The tongue has normal motion without fasciculations.   Coordination:    Normal finger to nose and heel to shin. Normal rapid alternating  movements.   Gait:    Heel-toe and tandem gait are normal.   Motor Observation:    No asymmetry, no atrophy, and no involuntary movements noted. Tone:    Normal muscle tone.    Posture:    Posture is normal. normal erect    Strength:    Strength is V/V in the upper and lower limbs.      Sensation: intact to LT     Reflex Exam:  DTR's:    Deep tendon reflexes in the upper and lower extremities are normal bilaterally.   Toes:    The toes are downgoing bilaterally.   Clonus:    Clonus is absent.      Assessment/Plan:  27 year old here with multiple complaints including ray nods phenomenon which causes sensory symptoms in the feet when she is having Raynaud's, occasional swelling in the left ankle and left forearm, occasional tingling in digits 1-3 of the right hand, fatigue.  - Raynaud's: She has been extensively and impressively worked up by Rheumatology. Unfortunately I have no more to add.  -swollen forearm and knee and ankle, tingling int he right digits 1-3 of hand: again, more primary care and rheumatology than neurology and she has been impressively worked up. Offered emg/ncs to evaluate for right carpal tunnel syndrome or other etiology but she declined - When she has raynaud's her feet may burn which is due to decreased blood flow at that time.  She has no sensory symptoms in her feet when not having Raynaud's.  Suspicion for peripheral neuropathy is very low but offered emg/ncs and she declined -  Not experiencing any symptoms today. Neurologic exam is normal. Offered MRI brain to rule out MS but she declines -Nonspecific symptoms which appear to occur occasionally and resolve not progressive. my suspicion for a neurologic cause is very low however offered further workup as above, if she decides she would like to have them please contact our office - RTC as needed  Cc: Nadeen Landau, MD  [facility] [address] [place], [place] [address]  Phone [phone] Fax [phone]

## 2018-01-02 NOTE — Patient Instructions (Signed)
Raynaud Phenomenon Raynaud phenomenon is a condition that affects the blood vessels (arteries) that carry blood to your fingers and toes. The arteries that supply blood to your ears or the tip of your nose might also be affected. Raynaud phenomenon causes the arteries to temporarily narrow. As a result, the flow of blood to the affected areas is temporarily decreased. This usually occurs in response to cold temperatures or stress. During an attack, the skin in the affected areas turns white. You may also feel tingling or numbness in those areas. Attacks usually last for only a brief period, and then the blood flow to the area returns to normal. In most cases, Raynaud phenomenon does not cause serious health problems. What are the causes? For many people with this condition, the cause is not known. Raynaud phenomenon is sometimes associated with other diseases, such as scleroderma or lupus. What increases the risk? Raynaud phenomenon can affect anyone, but it develops most often in people who are 20-40 years old. It affects more females than males. What are the signs or symptoms? Symptoms of Raynaud phenomenon may occur when you are exposed to cold temperatures or when you have emotional stress. The symptoms may last for a few minutes or up to several hours. They usually affect your fingers but may also affect your toes, ears, or the tip of your nose. Symptoms may include:  Changes in skin color. The skin in the affected areas will turn pale or white. The skin may then change from white to bluish to red as normal blood flow returns to the area.  Numbness, tingling, or pain in the affected areas.  In severe cases, sores may develop in the affected areas. How is this diagnosed? Your health care provider will do a physical exam and take your medical history. You may be asked to put your hands in cold water to check for a reaction to cold temperature. Blood tests may be done to check for other diseases or  conditions. Your health care provider may also order a test to check the movement of blood through your arteries and veins (vascular ultrasound). How is this treated? Treatment often involves making lifestyle changes and taking steps to control your exposure to cold temperatures. For more severe cases, medicine (calcium channel blockers) may be used to improve blood flow. Surgery is sometimes done to block the nerves that control the affected arteries, but this is rare. Follow these instructions at home:  Avoid exposure to cold by taking these steps: ? If possible, stay indoors during cold weather. ? When you go outside during cold weather, dress in layers and wear mittens, a hat, a scarf, and warm footwear. ? Wear mittens or gloves when handling ice or frozen food. ? Use holders for glasses or cans containing cold drinks. ? Let warm water run for a while before taking a shower or bath. ? Warm up the car before driving in cold weather.  If possible, avoid stressful and emotional situations. Exercise, meditation, and yoga may help you cope with stress. Biofeedback may be useful.  Do not use any tobacco products, including cigarettes, chewing tobacco, or electronic cigarettes. If you need help quitting, ask your health care provider.  Avoid secondhand smoke.  Limit your use of caffeine. Switch to decaffeinated coffee, tea, and soda. Avoid chocolate.  Wear loose fitting socks and comfortable, roomy shoes.  Avoid vibrating tools and machinery.  Take medicines only as directed by your health care provider. Contact a health care provider if:    Your discomfort becomes worse despite lifestyle changes.  You develop sores on your fingers or toes that do not heal.  Your fingers or toes turn black.  You have breaks in the skin on your fingers or toes.  You have a fever.  You have pain or swelling in your joints.  You have a rash.  Your symptoms occur on only one side of your body. This  information is not intended to replace advice given to you by your health care provider. Make sure you discuss any questions you have with your health care provider. Document Released: 04/30/2000 Document Revised: 10/09/2015 Document Reviewed: 11/05/2015 Elsevier Interactive Patient Education  2017 Elsevier Inc.  

## 2018-01-18 ENCOUNTER — Telehealth: Payer: Self-pay | Admitting: Physician Assistant

## 2018-01-18 NOTE — Telephone Encounter (Unsigned)
Copied from CRM [id number]. Topic: Bill or Statement - Patient/Guarantor Inquiry >> Jan 12, 2018  3:20 PM Amy Navarro wrote: Pt is calling in because she received a billing statement for DOS 07/06/17. Pt says the bill is for 3,577. Pt says that the labs should have been billed to her [employer] that she had at that time. Pt says that the provider assured her that the labs would be billed for the DOS but instead pt says that the labs were billed on the date that the labs were resulted. Pt says that she had a different insurance at that time.   Pt had [employer] until the end of February and then she had [employer] for the month of March and then back to [place] from April until currently. Pt would like further assistance.   CB: [phone] >> Jan 12, 2018  3:33 PM Samule Dry H wrote: Please send to the office. [facility] coding is unable to view coding/billing of lab charges.

## 2018-01-19 NOTE — Telephone Encounter (Signed)
Amy Navarro please advise. Dgaddy, CMA

## 2018-01-19 NOTE — Telephone Encounter (Signed)
Pt calling to find out what is going on with this.

## 2018-01-19 NOTE — Telephone Encounter (Signed)
Message sent to [facility] to handle.

## 2018-01-30 ENCOUNTER — Ambulatory Visit: Payer: BC Managed Care – PPO | Admitting: Neurology

## 2018-02-13 ENCOUNTER — Ambulatory Visit: Payer: Self-pay | Admitting: Adult Health

## 2018-02-13 VITALS — BP 115/75 | HR 101 | Temp 98.3°F | Resp 16 | Wt 117.2 lb

## 2018-02-13 DIAGNOSIS — H60501 Unspecified acute noninfective otitis externa, right ear: Secondary | ICD-10-CM

## 2018-02-13 DIAGNOSIS — H66001 Acute suppurative otitis media without spontaneous rupture of ear drum, right ear: Secondary | ICD-10-CM

## 2018-02-13 MED ORDER — NEOMYCIN-POLYMYXIN-HC 1 % OT SOLN: 3.0000 [drp] | Freq: Four times a day (QID) | OTIC | 0 refills | Status: DC

## 2018-02-13 MED ORDER — AMOXICILLIN-POT CLAVULANATE 875-125 MG PO TABS: 1.0000 | ORAL_TABLET | Freq: Two times a day (BID) | ORAL | 0 refills | Status: DC

## 2018-02-13 NOTE — Progress Notes (Signed)
Subjective:     Patient ID: Amy Navarro, female   DOB: [birth year], 27 y.o.   MRN: [id number]  HPI   Blood pressure 115/75, pulse (!) 101, temperature 98.3 F (36.8 C), temperature source Oral, resp. rate 16, weight 117 lb 3.2 oz (53.2 kg), SpO2 99 %. LMP 01/18/18 Denies any chance of pregnancy.   Patient is a 27 year old female in no acute distress who comes to the clinic for complaints of sore throat and right ear pain  She had gum surgery on front six teeth on 02/09/18 at a periodontist to have  gums raised. She denies any recent infections.    She reports she woke up on 9/28/ 19 with sore throat. She reports right ear pain started last night 02/12/18. She denies any difficulty swallowing or throat swelling.  She has been taking Advil- had last 400 mg three hours ago.  Reports my teeth do not hurt at all just my throat and ear.  She has had mild chills starting today.   She has been working today without difficulty.   Patient  denies any  rash, chest pain, shortness of breath, nausea, vomiting, or diarrhea.   Allergies  Allergen Reactions  . Latex Hives, Shortness Of Breath and Rash  . Guaifenesin Er Nausea And Vomiting   Review of Systems  Constitutional: Positive for chills (occasionally today she reports at work ). Negative for activity change, appetite change, diaphoresis, fatigue, fever and unexpected weight change.  HENT: Positive for dental problem (recent gum surgery), ear pain and sore throat. Negative for congestion, drooling, ear discharge, facial swelling, hearing loss, mouth sores, nosebleeds, postnasal drip, rhinorrhea, sinus pressure, sinus pain, sneezing, tinnitus, trouble swallowing and voice change.   Respiratory: Negative.   Cardiovascular: Negative.   Gastrointestinal: Negative.   Genitourinary: Negative.   Musculoskeletal: Negative.   Skin: Negative.   Neurological: Negative.   Hematological: Negative.        Objective:   Physical Exam    Constitutional: She appears well-developed and well-nourished. She is active.  Non-toxic appearance. She does not have a sickly appearance. She does not appear ill. No distress.  Patient is alert and oriented and responsive to questions Engages in eye contact with provider. Speaks in full sentences without any pauses without any shortness of breath or distress.    HENT:  Head: Normocephalic and atraumatic.  Right Ear: Hearing, tympanic membrane, external ear and ear canal normal. No drainage, swelling or tenderness. Tympanic membrane is not perforated and not erythematous. No middle ear effusion. No decreased hearing is noted.  Left Ear: Hearing and external ear normal. There is swelling (mild swelling ear canal able to visualize tympanic membrane ) and tenderness (with tragal pull ). No drainage. Tympanic membrane is erythematous. Tympanic membrane is not perforated. A middle ear effusion is present. No decreased hearing is noted.  Nose: Nose normal. No mucosal edema or rhinorrhea. Right sinus exhibits no maxillary sinus tenderness and no frontal sinus tenderness. Left sinus exhibits no maxillary sinus tenderness and no frontal sinus tenderness.  Mouth/Throat: Uvula is midline and mucous membranes are normal. No oral lesions. Abnormal dentition (see comment ). No dental abscesses, uvula swelling or dental caries. Posterior oropharyngeal erythema (mild erythema ) present. No oropharyngeal exudate, posterior oropharyngeal edema or tonsillar abscesses. Tonsils are 0 on the right. Tonsils are 0 on the left. No tonsillar exudate.    Front 6 upper teeth with recent gum surgery no gum swelling, no erythema upper palate or exterior gum  line. Stitches in place. Patient denies any gum or dental pain.   Eyes: Pupils are equal, round, and reactive to light. Conjunctivae, EOM and lids are normal.  Neck: Trachea normal, normal range of motion, full passive range of motion without pain and phonation normal. Neck  supple. No tracheal tenderness present. No tracheal deviation present. No Brudzinski's sign noted.  Cardiovascular: Normal rate, regular rhythm and normal heart sounds. Exam reveals no gallop and no friction rub.  No murmur heard. Pulmonary/Chest: Effort normal and breath sounds normal. No stridor. No respiratory distress. She has no decreased breath sounds. She has no wheezes. She has no rhonchi. She has no rales. She exhibits no tenderness.  Abdominal: Soft. Bowel sounds are normal. She exhibits no distension and no mass. There is no tenderness. There is no rebound and no guarding. No hernia.  Musculoskeletal: Normal range of motion.  Lymphadenopathy:       Head (right side): No submental, no submandibular, no tonsillar, no preauricular, no posterior auricular and no occipital adenopathy present.       Head (left side): No submental, no submandibular, no tonsillar, no preauricular, no posterior auricular and no occipital adenopathy present.    She has no cervical adenopathy.  Neurological: She is alert. She has normal strength and normal reflexes. GCS eye subscore is 4. GCS verbal subscore is 5. GCS motor subscore is 6.  Patient moves on and off of exam table and in room without difficulty. Gait is normal in hall and in room. Patient is oriented to person place time and situation. Patient answers questions appropriately and engages in conversation.  Skin: Skin is warm, dry and intact. Capillary refill takes less than 2 seconds. Nails show no clubbing.  Psychiatric: She has a normal mood and affect. Her speech is normal and behavior is normal. Judgment and thought content normal. Cognition and memory are normal.  Vitals reviewed.      Assessment:     Non-recurrent acute suppurative otitis media of right ear without spontaneous rupture of tympanic membrane  Acute otitis externa of right ear, unspecified type      Plan:     Meds ordered this encounter  Medications  .  amoxicillin-clavulanate (AUGMENTIN) 875-125 MG tablet    Sig: Take 1 tablet by mouth 2 (two) times daily.    Dispense:  20 tablet    Refill:  0  . NEOMYCIN-POLYMYXIN-HYDROCORTISONE (CORTISPORIN) 1 % SOLN OTIC solution    Sig: Place 3 drops into the right ear every 6 (six) hours.    Dispense:  10 mL    Refill:  0      Patient is to also call periodontist today and report symptoms. She will also follow instructions of periodontist given recent gum surgery.  She is advised to monitor her temperature without fever reducers. Go to Emergency room if fever over 100.5 without fever reducers , chills worsen or if any symptoms change or worsen at any time.   Advised patient call the office or your primary care doctor for an appointment if no improvement within 72 hours or if any symptoms change or worsen at any time  Advised ER or urgent Care if after hours or on weekend. Call 911 for emergency symptoms at any time.Patinet verbalized understanding of all instructions given/reviewed and treatment plan and has no further questions or concerns at this time.    Patient verbalized understanding of all instructions given and denies any further questions at this time.

## 2018-02-13 NOTE — Patient Instructions (Signed)
Otitis Media, Adult Otitis media is redness, soreness, and puffiness (swelling) in the space just behind your eardrum (middle ear). It may be caused by allergies or infection. It often happens along with a cold. Follow these instructions at home:  Take your medicine as told. Finish it even if you start to feel better.  Only take over-the-counter or prescription medicines for pain, discomfort, or fever as told by your doctor.  Follow up with your doctor as told. Contact a doctor if:  You have otitis media only in one ear, or bleeding from your nose, or both.  You notice a lump on your neck.  You are not getting better in 3-5 days.  You feel worse instead of better. Get help right away if:  You have pain that is not helped with medicine.  You have puffiness, redness, or pain around your ear.  You get a stiff neck.  You cannot move part of your face (paralysis).  You notice that the bone behind your ear hurts when you touch it. This information is not intended to replace advice given to you by your health care provider. Make sure you discuss any questions you have with your health care provider. Document Released: 10/20/2007 Document Revised: 10/09/2015 Document Reviewed: 11/28/2012 Elsevier Interactive Patient Education  2017 [employer]  

## 2018-03-07 ENCOUNTER — Telehealth: Payer: Self-pay | Admitting: Family Medicine

## 2018-03-07 NOTE — Telephone Encounter (Signed)
Copied from CRM [id number]. Topic: Complaint - Billing/Coding >> Mar 06, 2018 11:33 AM Maye Hides wrote: DOS: 07/19/17 Details of complaint: Pt states she is getting a bill from [facility] for $35.77 for date of service 07/19/17.[facility] is telling her this is something that needs to be fixed by our office.She would like a call back to let her know this has been done How would the patient like to see this issue resolved? Would like a return call   Will automatically be routed to [facility] pool. >> Mar 06, 2018  3:55 PM Zachery Dauer, [place] H wrote: Please send request to the office. [facility] coding can not view ordering/coding of labs.  Thank you,

## 2018-03-07 NOTE — Telephone Encounter (Signed)
Left message on pts voicemail to give me a call back re: [employer].

## 2018-03-09 ENCOUNTER — Ambulatory Visit: Payer: Self-pay | Admitting: Nurse Practitioner

## 2018-03-09 DIAGNOSIS — Z111 Encounter for screening for respiratory tuberculosis: Secondary | ICD-10-CM

## 2018-03-09 NOTE — Patient Instructions (Signed)
Ppd test only

## 2018-03-09 NOTE — Progress Notes (Signed)
Patient presents for PPD placement for work. Denies previous positive TB test  Denies known exposure to TB   Tuberculin skin test applied to left ventral forearm.  Patient informed to return to [facility] in 48-72 hours for PPD read. Vaccine Information Statement provided to patient.   

## 2018-03-10 ENCOUNTER — Encounter

## 2018-03-10 ENCOUNTER — Encounter: Payer: Self-pay | Admitting: Physician Assistant

## 2018-03-11 LAB — TB SKIN TEST
Induration: 0.5 mm
TB SKIN TEST: NEGATIVE

## 2018-03-14 ENCOUNTER — Ambulatory Visit (INDEPENDENT_AMBULATORY_CARE_PROVIDER_SITE_OTHER): Payer: BC Managed Care – PPO | Admitting: Emergency Medicine

## 2018-03-14 DIAGNOSIS — Z23 Encounter for immunization: Secondary | ICD-10-CM | POA: Diagnosis not present

## 2018-03-15 ENCOUNTER — Ambulatory Visit: Payer: Self-pay | Admitting: Family Medicine

## 2018-03-15 VITALS — BP 120/80 | HR 81 | Temp 98.3°F | Resp 20 | Wt 119.6 lb

## 2018-03-15 DIAGNOSIS — Z021 Encounter for pre-employment examination: Secondary | ICD-10-CM

## 2018-03-15 LAB — POCT URINALYSIS DIPSTICK
BILIRUBIN UA: NEGATIVE
Blood, UA: NEGATIVE
Glucose, UA: NEGATIVE
KETONES UA: NEGATIVE
Leukocytes, UA: NEGATIVE
NITRITE UA: NEGATIVE
Protein, UA: NEGATIVE
SPEC GRAV UA: 1.02 (ref 1.010–1.025)
UROBILINOGEN UA: 0.2 U/dL
pH, UA: 5.5 (ref 5.0–8.0)

## 2018-03-15 NOTE — Patient Instructions (Signed)

## 2018-03-15 NOTE — Addendum Note (Signed)
Addended by: Denton Brick F on: 03/15/2018 07:47 PM   Modules accepted: Orders

## 2018-03-15 NOTE — Progress Notes (Signed)
Amy Navarro is a 27 y.o. female who presents today with concerns of need for a physical. She has her vaccine records and recent PPD documentation with her. 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  Review of Systems  Constitutional: Negative for chills, fever and malaise/fatigue.  HENT: Negative for congestion, ear discharge, ear pain, sinus pain and sore throat.   Eyes: Negative.   Respiratory: Negative for cough, sputum production and shortness of breath.   Cardiovascular: Negative.  Negative for chest pain.  Gastrointestinal: Negative for abdominal pain, diarrhea, nausea and vomiting.  Genitourinary: Negative for dysuria, frequency, hematuria and urgency.  Musculoskeletal: Negative for myalgias.  Skin: Negative.   Neurological: Negative for headaches.  Endo/Heme/Allergies: Negative.   Psychiatric/Behavioral: Negative.     O: Vitals:   03/15/18 1512  BP: 120/80  Pulse: 81  Resp: 20  Temp: 98.3 F (36.8 C)  SpO2: 98%     Physical Exam  Constitutional: She is oriented to person, place, and time. Vital signs are normal. She appears well-developed and well-nourished. She is active.  Non-toxic appearance. She does not have a sickly appearance.  HENT:  Head: Normocephalic.  Right Ear: Hearing, tympanic membrane, external ear and ear canal normal.  Left Ear: Hearing, tympanic membrane, external ear and ear canal normal.  Nose: Nose normal.  Mouth/Throat: Uvula is midline and oropharynx is clear and moist.  Neck: Normal range of motion. Neck supple.  Cardiovascular: Normal rate, regular rhythm, normal heart sounds and normal pulses.  Pulmonary/Chest: Effort normal and breath sounds normal.  Abdominal: Soft. Bowel sounds are normal.  Musculoskeletal: Normal range of motion.  Lymphadenopathy:       Head (right side): No submental and no submandibular adenopathy present.       Head (left side): No submental and no submandibular  adenopathy present.    She has no cervical adenopathy.  Neurological: She is alert and oriented to person, place, and time.  Psychiatric: She has a normal mood and affect.  Vitals reviewed.  A: 1. Physical exam, pre-employment    P: Discussed exam findings, diagnosis etiology and medication use and indications reviewed with patient. Follow- Up and discharge instructions provided. No emergent/urgent issues found on exam.  Patient verbalized understanding of information provided and agrees with plan of care (POC), all questions answered.  1. Physical exam, pre-employment Normal physical exam- all forms filled out scanned in and originals returned to patient.

## 2018-03-17 ENCOUNTER — Telehealth: Payer: Self-pay

## 2018-03-17 NOTE — Telephone Encounter (Signed)
Patient was not available.

## 2018-03-23 ENCOUNTER — Encounter: Payer: BC Managed Care – PPO | Admitting: Physician Assistant

## 2018-03-23 ENCOUNTER — Encounter

## 2018-04-07 ENCOUNTER — Other Ambulatory Visit: Payer: Self-pay | Admitting: Physician Assistant

## 2018-04-07 DIAGNOSIS — I7300 Raynaud's syndrome without gangrene: Secondary | ICD-10-CM

## 2018-04-20 ENCOUNTER — Encounter: Payer: Self-pay | Admitting: Family Medicine

## 2018-06-29 ENCOUNTER — Encounter: Payer: Self-pay | Admitting: Family Medicine

## 2018-09-19 ENCOUNTER — Other Ambulatory Visit: Payer: Self-pay

## 2018-09-19 ENCOUNTER — Telehealth (INDEPENDENT_AMBULATORY_CARE_PROVIDER_SITE_OTHER): Payer: BC Managed Care – PPO | Admitting: Emergency Medicine

## 2018-09-19 ENCOUNTER — Encounter: Payer: Self-pay | Admitting: Emergency Medicine

## 2018-09-19 DIAGNOSIS — K047 Periapical abscess without sinus: Secondary | ICD-10-CM | POA: Diagnosis not present

## 2018-09-19 DIAGNOSIS — R590 Localized enlarged lymph nodes: Secondary | ICD-10-CM

## 2018-09-19 MED ORDER — AMOXICILLIN-POT CLAVULANATE 875-125 MG PO TABS: 1.0000 | ORAL_TABLET | Freq: Two times a day (BID) | ORAL | 0 refills | Status: AC

## 2018-09-19 NOTE — Progress Notes (Signed)
Telemedicine Encounter- SOAP NOTE Established Patient  This video-telephone encounter was conducted with the patient's (or proxy's) verbal consent via audio telecommunications: yes/no: Yes Patient was instructed to have this encounter in a suitably private space; and to only have persons present to whom they give permission to participate. In addition, patient identity was confirmed by use of name plus two identifiers (DOB and address).  I discussed the limitations, risks, security and privacy concerns of performing an evaluation and management service by telephone and the availability of in person appointments. I also discussed with the patient that there may be a patient responsible charge related to this service. The patient expressed understanding and agreed to proceed.  I spent a total of TIME; 0 MIN TO 60 MIN: 15 minutes talking with the patient or their proxy.  No chief complaint on file. Right submandibular pain  Subjective   Amy Navarro is a 28 y.o. female established patient. Telephone visit today for pain to right submandibular area that started about 1 week ago.  Patient thinks this may be a dental infection.  No other significant symptoms.  Denies fever or chills.  Denies difficulty breathing.  Denies difficulty swallowing.  No other complaints or medical concerns today.  HPI   Patient Active Problem List   Diagnosis Date Noted  . POTS (postural orthostatic tachycardia syndrome) 08/16/2017  . Pain and swelling of left forearm 12/22/2016  . Dermatitis 09/04/2016  . Hair loss 09/04/2016  . Itchy scalp 09/04/2016  . Chest pain 09/12/2015  . Palpitations 05/02/2015  . Heart murmur 02/15/2015  . Exercise-induced asthma 04/13/2013  . Raynaud's disease 04/13/2013    Past Medical History:  Diagnosis Date  . ADHD   . Anxiety   . Asthma   . Blood transfusion without reported diagnosis   . POTS (postural orthostatic tachycardia syndrome)   . Raynaud disease      Current Outpatient Medications  Medication Sig Dispense Refill  . albuterol (PROVENTIL) (5 MG/ML) 0.5% nebulizer solution Take 2.5 mg by nebulization every 6 (six) hours as needed for wheezing or shortness of breath.    . ALPRAZolam (XANAX) 0.5 MG tablet Take 1 tablet (0.5 mg total) by mouth at bedtime as needed for anxiety. 20 tablet 0  . amLODipine (NORVASC) 5 MG tablet Take 0.5-1 tablets (2.5-5 mg total) by mouth daily. 90 tablet 3  . amphetamine-dextroamphetamine (ADDERALL) 20 MG tablet Take 20 mg by mouth daily.    Marland Kitchen BIOTIN PO Take 10,000 mcg by mouth daily.    . Cetirizine HCl (WAL-ZYR PO) Take 1 tablet by mouth daily.    . Cholecalciferol (VITAMIN D) 2000 units tablet Take 2,000 Units by mouth daily.    . clindamycin (CLINDAGEL) 1 % gel APP TO ACNE QHS  5  . lamoTRIgine (LAMICTAL) 25 MG tablet Take 1 tablet by mouth daily  1  . naproxen sodium (ANAPROX) 550 MG tablet TK 1 T PO Q 12 H PRN  0  . Norethindrone Acetate-Ethinyl Estrad-FE (JUNEL FE 24) 1-20 MG-MCG(24) tablet Take 1 tablet by mouth daily.    Marland Kitchen amoxicillin-clavulanate (AUGMENTIN) 875-125 MG tablet Take 1 tablet by mouth 2 (two) times daily for 7 days. 14 tablet 0  . chlorhexidine (PERIDEX) 0.12 % solution   0  . cyclobenzaprine (FLEXERIL) 10 MG tablet Take 0.5-1 tablets (5-10 mg total) by mouth 3 (three) times daily as needed. (Patient not taking: Reported on 09/19/2018) 30 tablet 3  . NEOMYCIN-POLYMYXIN-HYDROCORTISONE (CORTISPORIN) 1 % SOLN OTIC solution Place 3  drops into the right ear every 6 (six) hours. (Patient not taking: Reported on 09/19/2018) 10 mL 0  . norethindrone-ethinyl estradiol 1/35 (NORTREL 1/35, 28,) tablet TAKE 1 TABLET BY MOUTH EVERY DAY (Patient not taking: Reported on 09/19/2018) 28 tablet 0  . spironolactone (ALDACTONE) 25 MG tablet TK 1 T PO BID  2  . traMADol (ULTRAM) 50 MG tablet Take 1 tablet (50 mg total) by mouth every 8 (eight) hours as needed for severe pain. (Patient not taking: Reported on 09/19/2018) 60  tablet 1   Current Facility-Administered Medications  Medication Dose Route Frequency Provider Last Rate Last Dose  . cyanocobalamin ((VITAMIN B-12)) injection 1,000 mcg  1,000 mcg Intramuscular Q30 days Deliah Boston L, PA-C   1,000 mcg at 10/21/17 1029  . methylPREDNISolone acetate (DEPO-MEDROL) injection 120 mg  120 mg Intramuscular Once Sherren Mocha, MD        Allergies  Allergen Reactions  . Latex Hives, Shortness Of Breath and Rash  . Guaifenesin Er Nausea And Vomiting    Social History   Socioeconomic History  . Marital status: Single    Spouse name: Not on file  . Number of children: Not on file  . Years of education: Not on file  . Highest education level: Master's degree (e.g., MA, MS, MEng, MEd, MSW, MBA)  Occupational History  . Not on file  Social Needs  . Financial resource strain: Not on file  . Food insecurity:    Worry: Not on file    Inability: Not on file  . Transportation needs:    Medical: Not on file    Non-medical: Not on file  Tobacco Use  . Smoking status: Never Smoker  . Smokeless tobacco: Never Used  Substance and Sexual Activity  . Alcohol use: Yes    Comment: occ  . Drug use: Never  . Sexual activity: Yes    Birth control/protection: None, Pill  Lifestyle  . Physical activity:    Days per week: Not on file    Minutes per session: Not on file  . Stress: Not on file  Relationships  . Social connections:    Talks on phone: Not on file    Gets together: Not on file    Attends religious service: Not on file    Active member of club or organization: Not on file    Attends meetings of clubs or organizations: Not on file    Relationship status: Not on file  . Intimate partner violence:    Fear of current or ex partner: Not on file    Emotionally abused: Not on file    Physically abused: Not on file    Forced sexual activity: Not on file  Other Topics Concern  . Not on file  Social History Narrative   Lives at home with her mother-  about to move    Right handed   Caffeine: 1 cup daily    Review of Systems  Constitutional: Negative.  Negative for chills and fever.  HENT: Negative for congestion, ear pain, sinus pain and sore throat.        Right submandibular pain  Eyes: Negative.   Respiratory: Negative.  Negative for shortness of breath.   Cardiovascular: Negative.  Negative for chest pain and palpitations.  Gastrointestinal: Negative for abdominal pain, nausea and vomiting.  Musculoskeletal: Negative for myalgias.  Skin: Negative.   Neurological: Negative for dizziness and headaches.  All other systems reviewed and are negative.   Objective  Vitals as reported by the patient: None available There were no vitals filed for this visit. Physical Exam Constitutional:      Appearance: Normal appearance.  HENT:     Head: Normocephalic.     Nose: Nose normal.  Eyes:     Extraocular Movements: Extraocular movements intact.  Neck:     Musculoskeletal: Normal range of motion.  Pulmonary:     Effort: Pulmonary effort is normal.  Neurological:     Mental Status: She is alert and oriented to person, place, and time.  Psychiatric:        Mood and Affect: Mood normal.    There are no diagnoses linked to this encounter. Diagnoses and all orders for this visit:  Dental infection  Submandibular lymphadenopathy  Other orders -     amoxicillin-clavulanate (AUGMENTIN) 875-125 MG tablet; Take 1 tablet by mouth 2 (two) times daily for 7 days.  Clinically stable.  No red flag signs or symptoms.  Most likely dental infection.  Will start Augmentin twice a day for 7 days. Office visit and or dental visit if no better in 1 week.  I discussed the assessment and treatment plan with the patient. The patient was provided an opportunity to ask questions and all were answered. The patient agreed with the plan and demonstrated an understanding of the instructions.   The patient was advised to call back or seek an  in-person evaluation if the symptoms worsen or if the condition fails to improve as anticipated.  I provided 15 minutes of non-face-to-face time during this encounter.  Georgina QuintMiguel Jose Penny Frisbie, MD  [facility]

## 2018-09-19 NOTE — Progress Notes (Signed)
Contacted patient to triage for appointment. Patient states she has mid-line jaw pain on the right side with tenderness for 1 week. Patient states she take Advil for pain and it helps some. Patient states no fever.

## 2018-11-30 ENCOUNTER — Other Ambulatory Visit: Payer: Self-pay | Admitting: Physician Assistant

## 2018-11-30 DIAGNOSIS — I7300 Raynaud's syndrome without gangrene: Secondary | ICD-10-CM

## 2018-11-30 NOTE — Telephone Encounter (Signed)
Requested medication (s) are due for refill today: Yes  Requested medication (s) are on the active medication list: Yes  Last refill:  10/22/2018  Future visit scheduled: No  Notes to clinic:  Prescription has expired.    Requested Prescriptions  Pending Prescriptions Disp Refills   amLODipine (NORVASC) 5 MG tablet [Pharmacy Med Name: AMLODIPINE BESYLATE '5MG'$  TABLETS] 90 tablet 3    Sig: TAKE 1/2 TO 1 TABLET BY MOUTH DAILY     Cardiovascular:  Calcium Channel Blockers Failed - 11/30/2018  3:40 AM      Failed - Valid encounter within last 6 months    Recent Outpatient Visits          8 months ago Need for measles-mumps-rubella (MMR) vaccine   [facility] at [facility], Ines Bloomer, MD   1 year ago Fatigue, unspecified type   [facility] at [place], PA-C   1 year ago Acute leg pain, left   [facility], Laurey Arrow, MD   1 year ago Anxiety state   [facility] at [place], PA-C   1 year ago Encounter for vitamin deficiency screening   [facility] at [facility], [place], PA-C             Passed - Last BP in normal range    BP Readings from Last 1 Encounters:  03/15/18 120/80

## 2019-09-28 IMAGING — DX DG ANKLE COMPLETE 3+V*L*
3 series · 3 of 3 positions shown · non-contrast
Comparison: None.

CLINICAL DATA: 27 y/o F; 1 month prior fell. Pain to the calcaneus
radiating to the posterior calf and knee.

EXAM:
LEFT FOOT - COMPLETE 3+ VIEW; LEFT ANKLE COMPLETE - 3+ VIEW

[ankle obl]
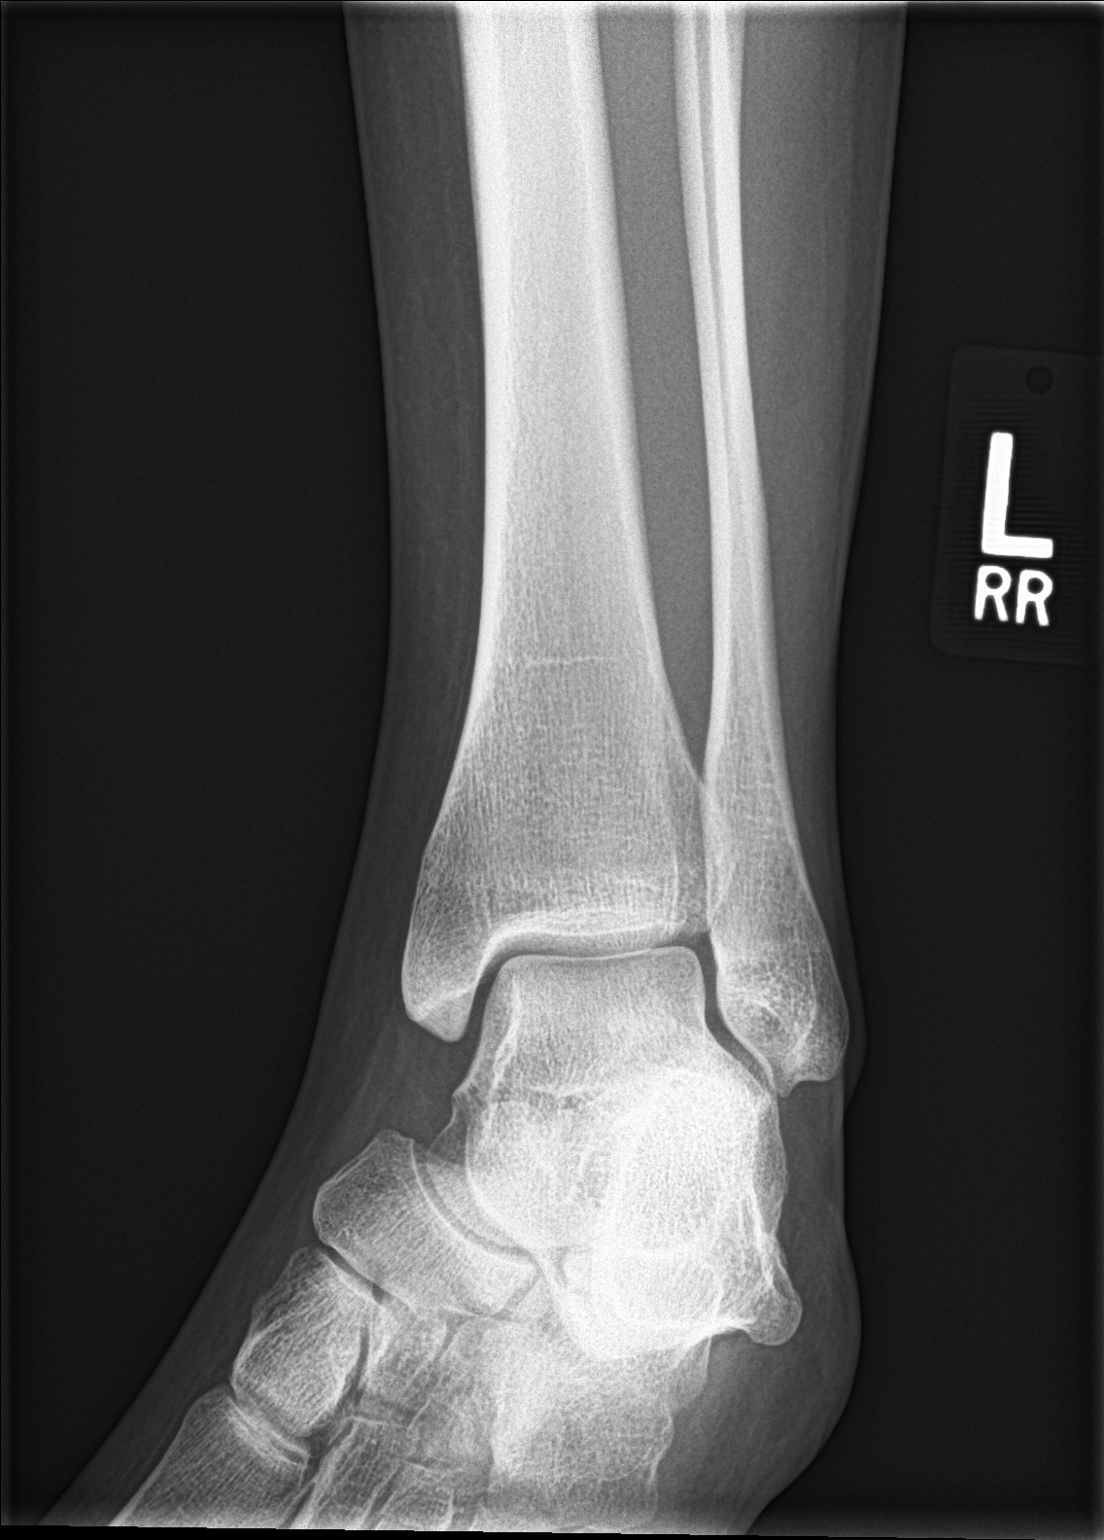

[ankle ap]
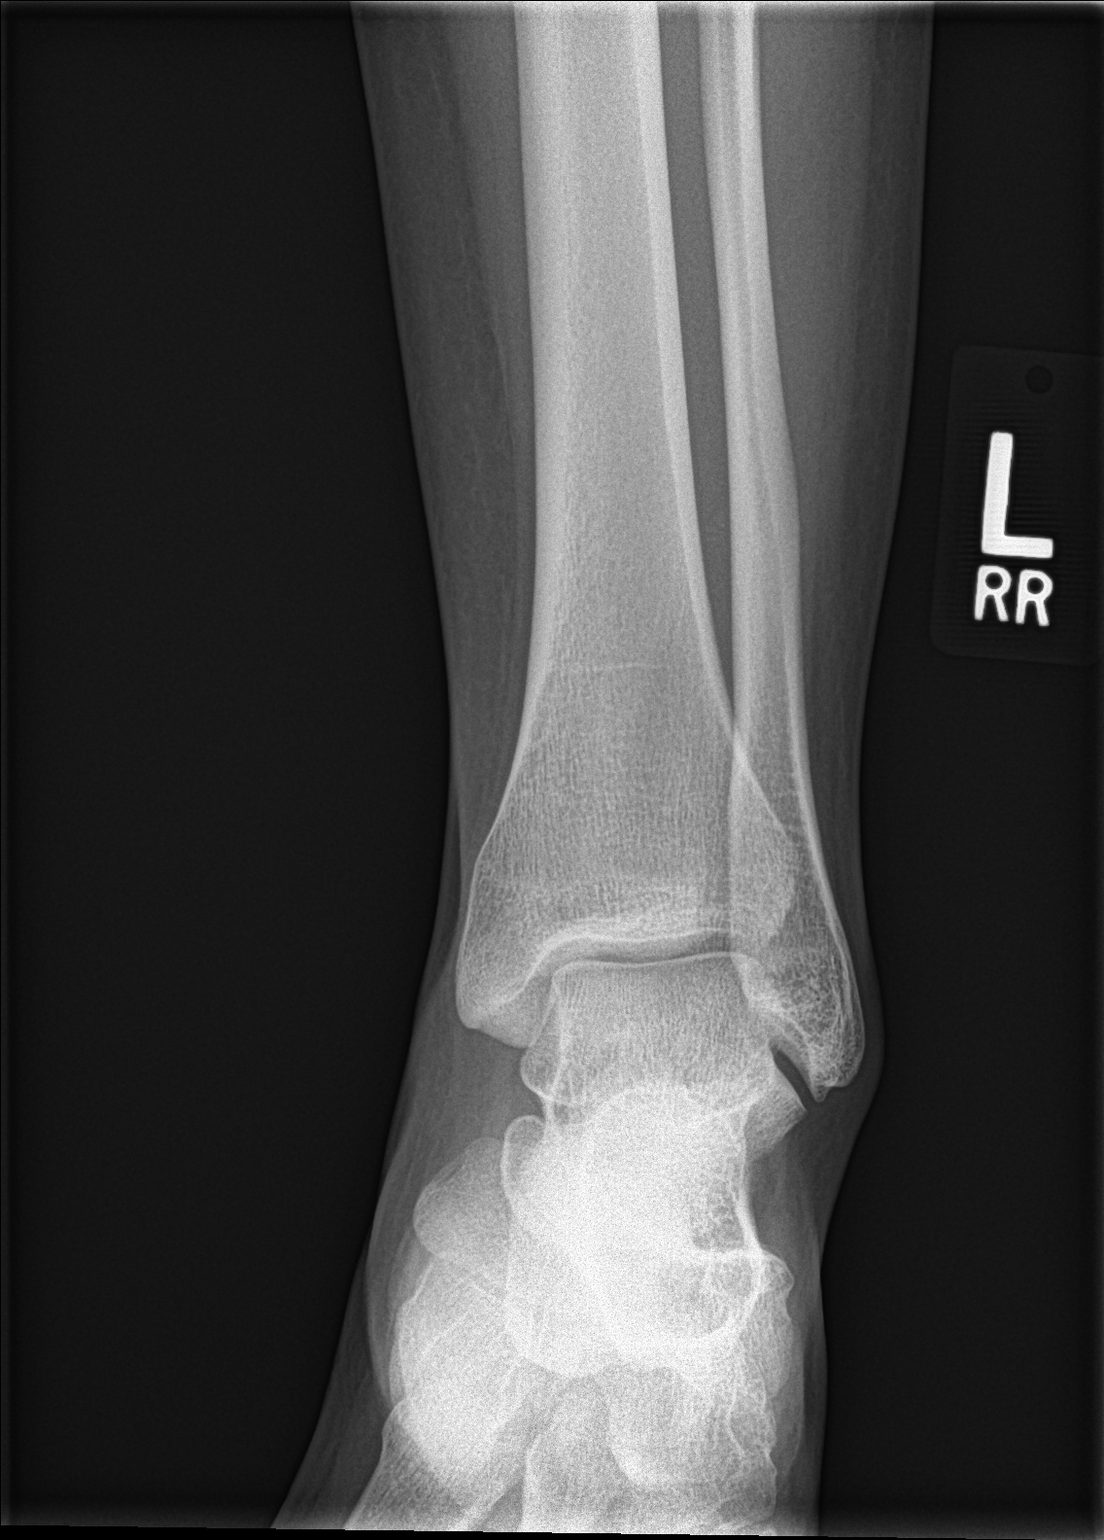

[ankle lat]
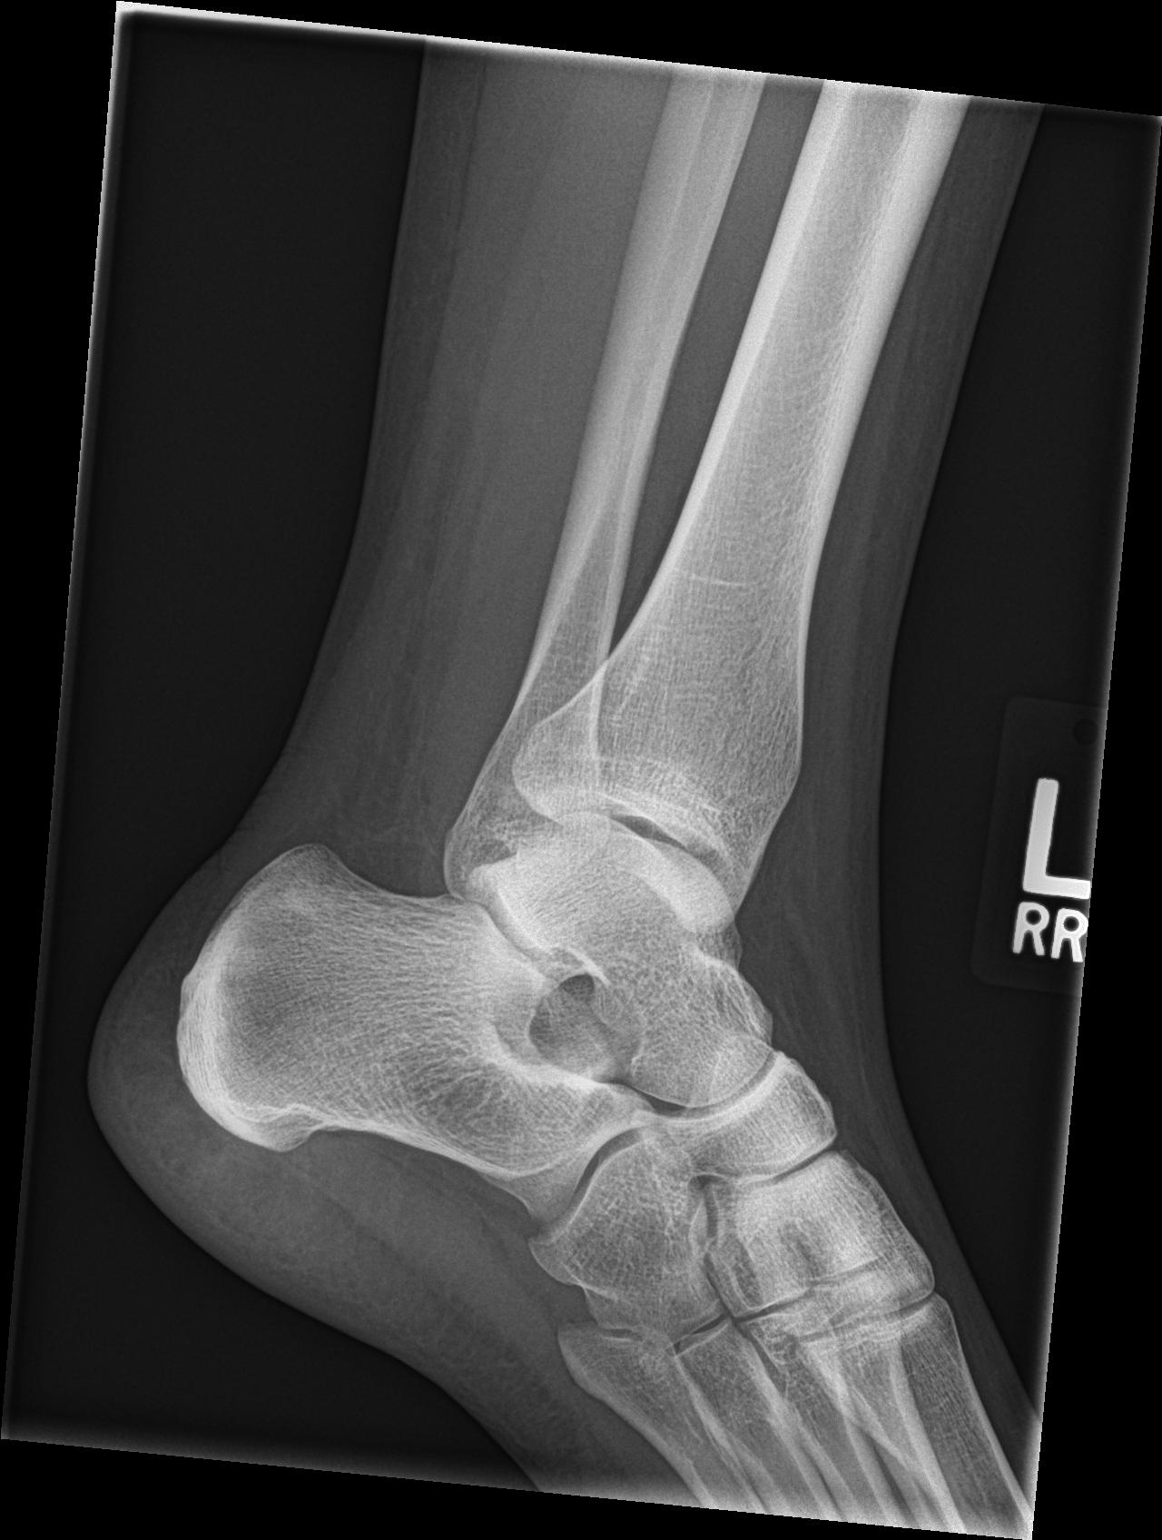

[3 of 3 positions shown; findings below may reference images not displayed]

FINDINGS: Left foot:

There is no evidence of fracture or dislocation. There is no
evidence of arthropathy or other focal bone abnormality. Soft
tissues are unremarkable.

Left ankle:

There is no evidence of fracture or dislocation. There is no
evidence of arthropathy or other focal bone abnormality. Soft
tissues are unremarkable.
IMPRESSION: Negative.

By: Ourari Tiger M.D.

## 2019-09-28 IMAGING — DX DG KNEE COMPLETE 4+V*L*
4 series · 4 of 4 positions shown · non-contrast
Comparison: None.

CLINICAL DATA: 27 y/o F; 1 month prior fell. Pain to the calcaneus
radiating to the posterior calf and knee.

EXAM:
LEFT KNEE - COMPLETE 4+ VIEW

[knee ap]
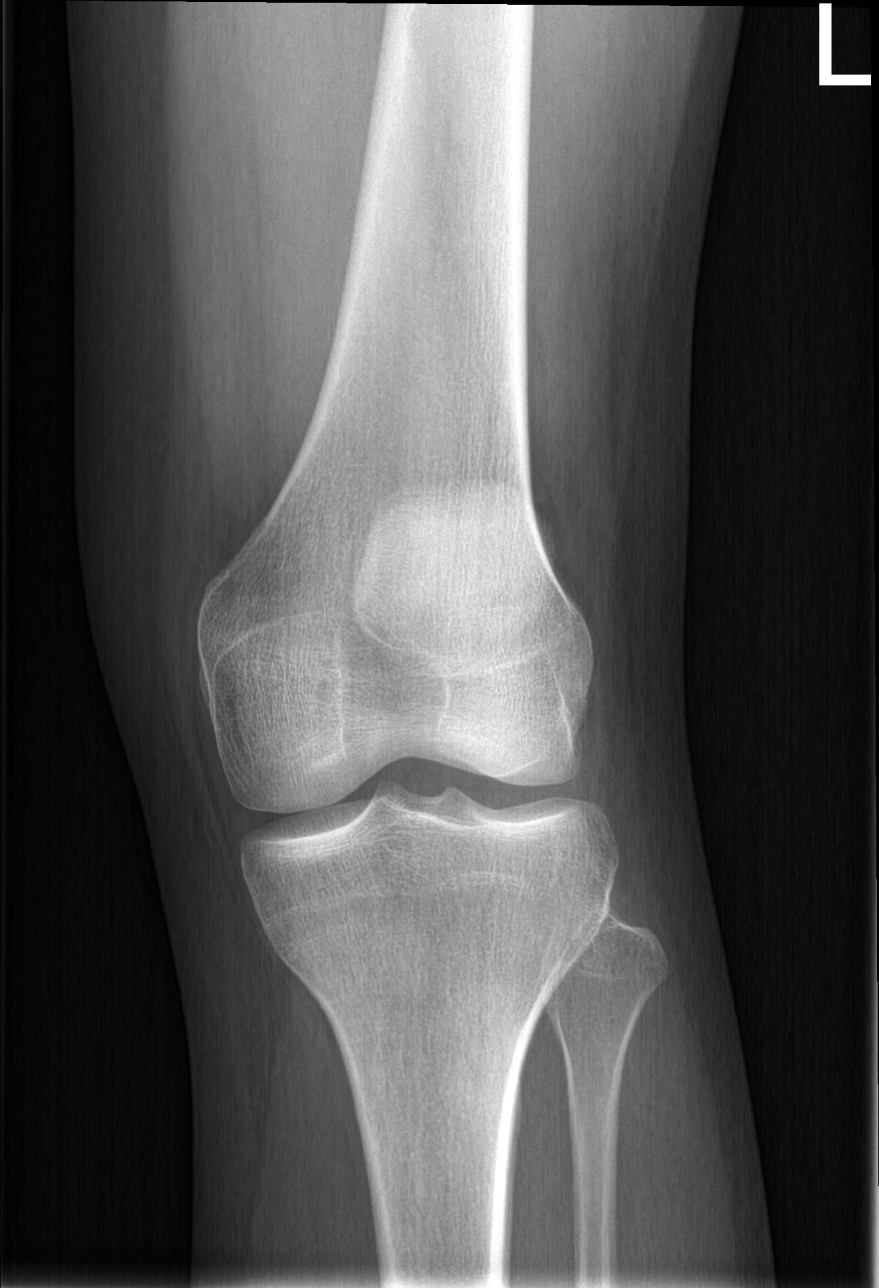

[knee obl (1 of 2)]
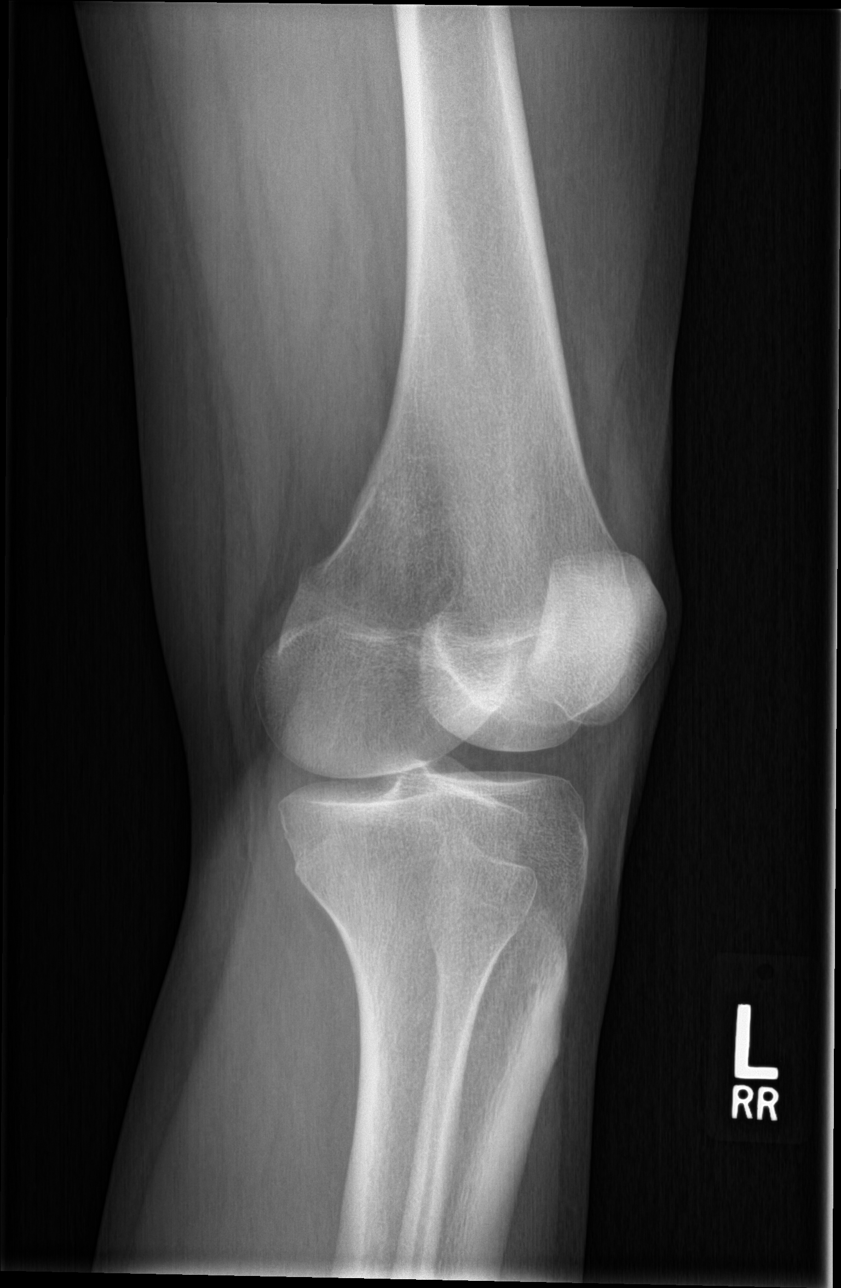

[knee obl (2 of 2)]
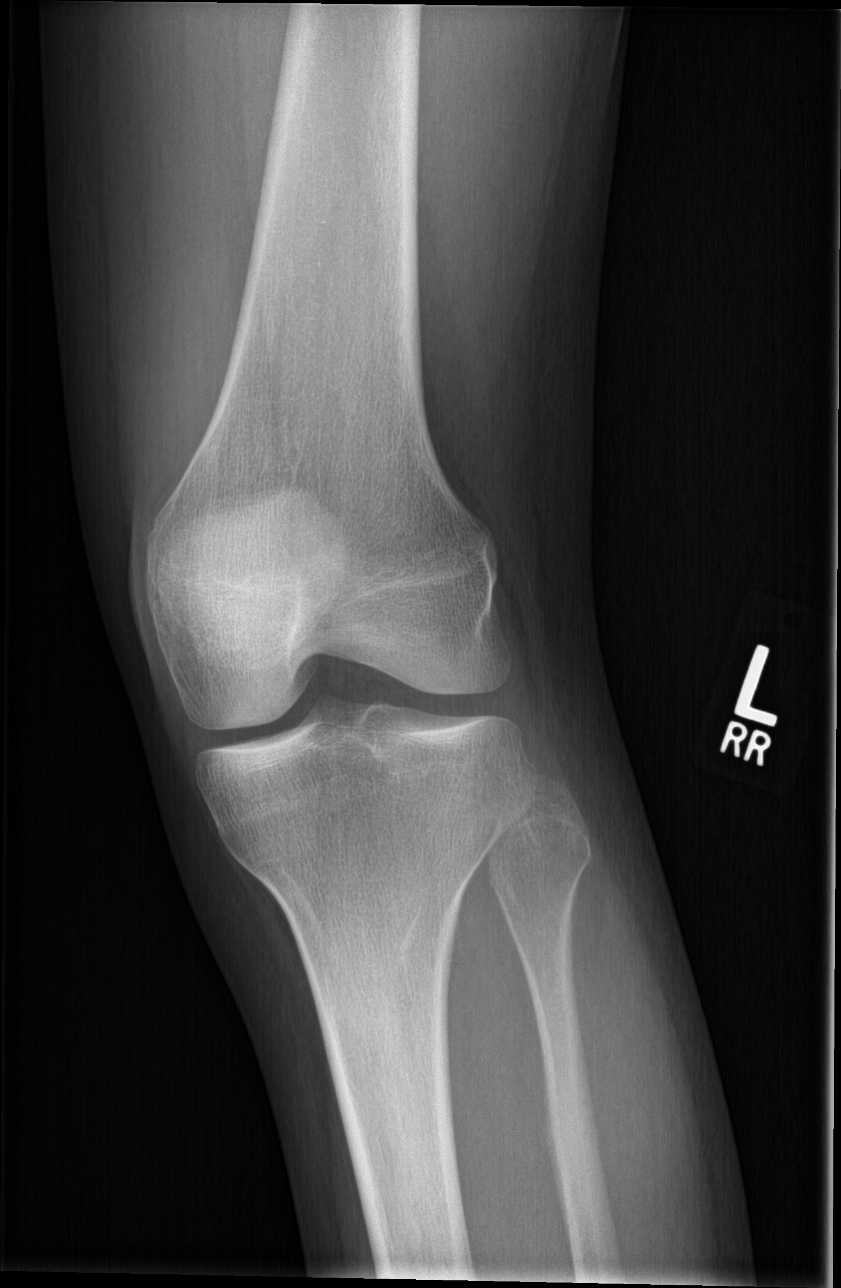

[knee lat]
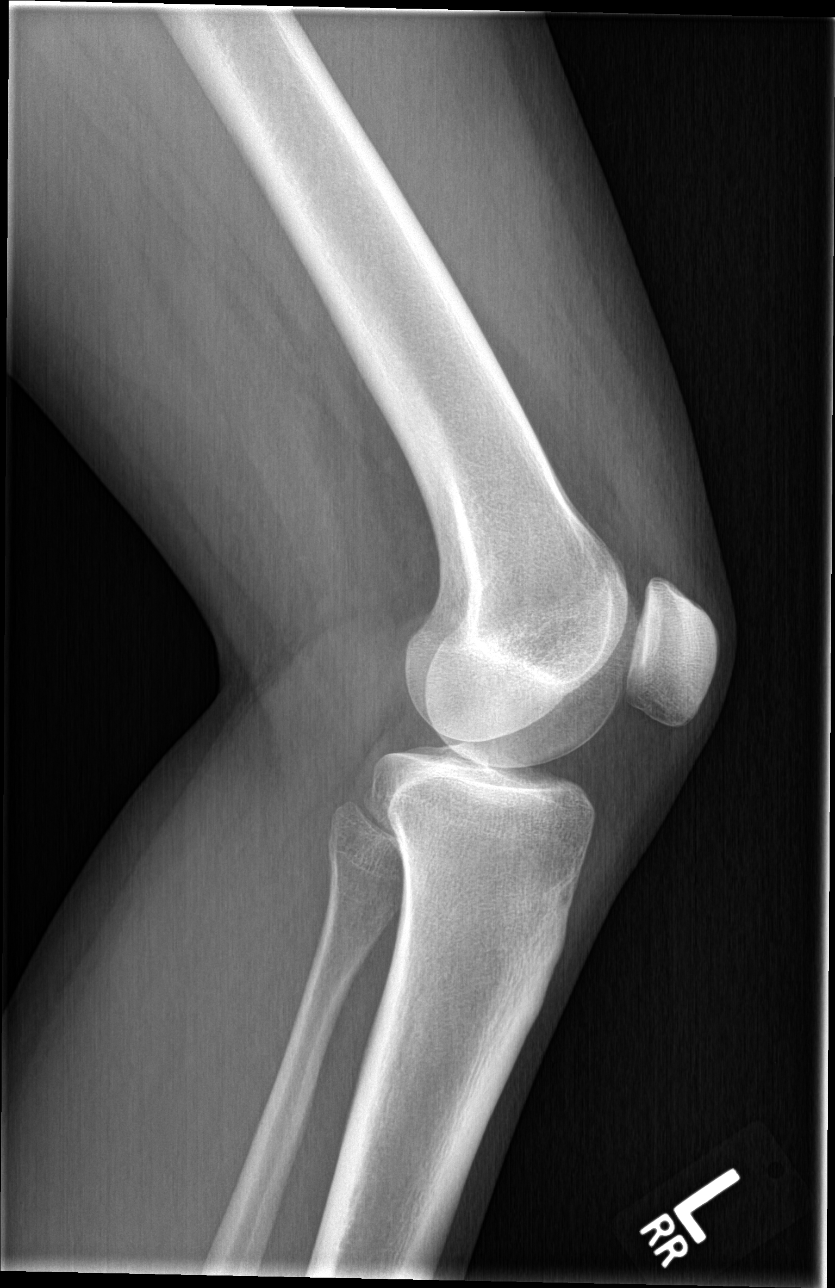

[4 of 4 positions shown; findings below may reference images not displayed]

FINDINGS: No evidence of fracture, dislocation, or joint effusion. No evidence
of arthropathy or other focal bone abnormality. Soft tissues are
unremarkable.
IMPRESSION: Negative.

By: Shenika Kozak M.D.

## 2019-10-10 ENCOUNTER — Ambulatory Visit: Payer: Self-pay | Admitting: Physician Assistant

## 2019-11-08 ENCOUNTER — Telehealth: Payer: Self-pay | Admitting: *Deleted

## 2019-11-08 NOTE — Telephone Encounter (Signed)
Faxed refill request for Amlodipine 5 mg to [facility]. Request denied patient needs schedule an appointment for additional refills. Last office visit was over a year ago.

## 2020-01-13 ENCOUNTER — Other Ambulatory Visit: Payer: Self-pay | Admitting: Dermatology

## 2020-04-24 ENCOUNTER — Other Ambulatory Visit: Payer: Self-pay | Admitting: Dermatology

## 2020-09-15 ENCOUNTER — Other Ambulatory Visit: Payer: Self-pay | Admitting: Dermatology

## 2020-09-16 ENCOUNTER — Other Ambulatory Visit: Payer: Self-pay | Admitting: Dermatology

## 2020-09-23 ENCOUNTER — Other Ambulatory Visit: Payer: Self-pay | Admitting: Dermatology

## 2020-12-03 ENCOUNTER — Ambulatory Visit: Payer: BLUE CROSS/BLUE SHIELD | Admitting: Dermatology

## 2020-12-03 ENCOUNTER — Other Ambulatory Visit: Payer: Self-pay

## 2020-12-03 ENCOUNTER — Encounter: Payer: Self-pay | Admitting: Dermatology

## 2020-12-03 VITALS — BP 124/88

## 2020-12-03 DIAGNOSIS — L700 Acne vulgaris: Secondary | ICD-10-CM

## 2020-12-03 MED ORDER — SPIRONOLACTONE 25 MG PO TABS: 25.0000 mg | ORAL_TABLET | Freq: Two times a day (BID) | ORAL | 11 refills | Status: AC

## 2020-12-12 NOTE — Progress Notes (Signed)
   Follow-Up Visit   Subjective  Amy Navarro is a 30 y.o. female who presents for the following: Medication Refill (Patient here today for refill on Spironolactone for her acne. Per patient treatment is going well. ).  Acne Location:  Duration:  Quality:  Associated Signs/Symptoms: Modifying Factors:  Severity:  Timing: Context:   Objective  Well appearing patient in no apparent distress; mood and affect are within normal limits. Head - Anterior (Face) Inflammatory acne under good control    A focused examination was performed including head and neck.. Relevant physical exam findings are noted in the Assessment and Plan.   Assessment & Plan    Acne vulgaris Head - Anterior (Face)  BP 124/88.  Reviewed with patient the need to avoid pregnancy while on spironolactone therapy.  Also discussed the new topical hormone therapy clascoterone if she would like to consider trying to stop systemic therapy.  For now we will okay refilling spironolactone for 1 year.  spironolactone (ALDACTONE) 25 MG tablet - Head - Anterior (Face) Take 1 tablet (25 mg total) by mouth 2 (two) times daily.      I, Janalyn Harder, MD, have reviewed all documentation for this visit.  The documentation on 12/12/20 for the exam, diagnosis, procedures, and orders are all accurate and complete.
